# Patient Record
Sex: Female | Born: 1970
Health system: Southern US, Community
[De-identification: ages and names within clinical notes are randomized; demographics above are authoritative.]

## PROBLEM LIST (undated history)

## (undated) DIAGNOSIS — T4145XA Adverse effect of unspecified anesthetic, initial encounter: Secondary | ICD-10-CM

## (undated) DIAGNOSIS — K219 Gastro-esophageal reflux disease without esophagitis: Secondary | ICD-10-CM

## (undated) DIAGNOSIS — T884XXA Failed or difficult intubation, initial encounter: Secondary | ICD-10-CM

## (undated) DIAGNOSIS — T8859XA Other complications of anesthesia, initial encounter: Secondary | ICD-10-CM

## (undated) HISTORY — PX: CERVICAL BIOPSY  W/ LOOP ELECTRODE EXCISION: SUR135

## (undated) HISTORY — PX: ABDOMINAL HYSTERECTOMY: SHX81

## (undated) HISTORY — PX: LEEP: SHX91

## (undated) HISTORY — PX: DILATION AND CURETTAGE OF UTERUS: SHX78

## (undated) HISTORY — PX: ENDOMETRIAL ABLATION: SHX621

---

## 2004-08-14 HISTORY — PX: BREAST BIOPSY: SHX20

## 2005-11-29 ENCOUNTER — Ambulatory Visit: Payer: Self-pay

## 2006-12-03 ENCOUNTER — Ambulatory Visit: Payer: Self-pay | Admitting: General Surgery

## 2007-05-18 ENCOUNTER — Ambulatory Visit: Payer: Self-pay | Admitting: Internal Medicine

## 2009-01-12 ENCOUNTER — Ambulatory Visit: Payer: Self-pay

## 2009-01-14 ENCOUNTER — Ambulatory Visit: Payer: Self-pay

## 2010-09-22 ENCOUNTER — Ambulatory Visit: Payer: Self-pay

## 2010-09-29 ENCOUNTER — Ambulatory Visit: Payer: Self-pay

## 2010-10-03 LAB — PATHOLOGY REPORT

## 2011-12-11 ENCOUNTER — Encounter: Payer: Self-pay | Admitting: Internal Medicine

## 2011-12-11 ENCOUNTER — Ambulatory Visit (INDEPENDENT_AMBULATORY_CARE_PROVIDER_SITE_OTHER): Payer: BC Managed Care – PPO | Admitting: Internal Medicine

## 2011-12-11 VITALS — BP 126/78 | HR 78 | Temp 98.6°F | Resp 14 | Ht 62.0 in | Wt 152.0 lb

## 2011-12-11 DIAGNOSIS — N920 Excessive and frequent menstruation with regular cycle: Secondary | ICD-10-CM

## 2011-12-11 LAB — HM PAP SMEAR: HM Pap smear: NORMAL

## 2011-12-11 LAB — HM MAMMOGRAPHY

## 2011-12-11 NOTE — Assessment & Plan Note (Signed)
Will get notes from OB/GYN as to previous evaluation and management. Discussed with patient potential of starting oral contraceptive pill to help with bleeding, but she prefers not to take daily medication if possible. We'll obtain recent lab work, including CBC to see if any evidence of iron deficiency anemia. She will followup here as needed and in one year for physical exam.

## 2011-12-11 NOTE — Progress Notes (Signed)
  Subjective:    Patient ID: Kristina Pena, female    DOB: 06/09/1971, 41 y.o.   MRN: [id number]  HPI 41 year old female with history of menorrhagia presents to establish care. She reports she is generally feeling well. She has no new concerns today. In regards to her history of menorrhagia, she reports that she has undergone endometrial ablation and D&C with no improvement in her bleeding. She is currently discussing hysterectomy with her OB/GYN. She denies feeling significant fatigue, lightheadedness, headaches, or other complaints.  No outpatient encounter prescriptions on file as of 12/11/2011.    Review of Systems  Constitutional: Negative for fever, chills, appetite change, fatigue and unexpected weight change.  HENT: Negative for ear pain, congestion, sore throat, trouble swallowing, neck pain, voice change and sinus pressure.   Eyes: Negative for visual disturbance.  Respiratory: Negative for cough, shortness of breath, wheezing and stridor.   Cardiovascular: Negative for chest pain, palpitations and leg swelling.  Gastrointestinal: Negative for nausea, vomiting, abdominal pain, diarrhea, constipation, blood in stool, abdominal distention and anal bleeding.  Genitourinary: Negative for dysuria and flank pain.  Musculoskeletal: Negative for myalgias, arthralgias and gait problem.  Skin: Negative for color change and rash.  Neurological: Negative for dizziness and headaches.  Hematological: Negative for adenopathy. Does not bruise/bleed easily.  Psychiatric/Behavioral: Negative for suicidal ideas, sleep disturbance and dysphoric mood. The patient is not nervous/anxious.    BP 126/78  Pulse 78  Temp(Src) 98.6 F (37 C) (Oral)  Resp 14  Ht 5\' 2"  (1.575 m)  Wt 152 lb (68.947 kg)  BMI 27.80 kg/m2  LMP 11/29/2011     Objective:   Physical Exam  Constitutional: She is oriented to person, place, and time. She appears well-developed and well-nourished. No distress.  HENT:  Head:  Normocephalic and atraumatic.  Right Ear: External ear normal.  Left Ear: External ear normal.  Nose: Nose normal.  Mouth/Throat: Oropharynx is clear and moist. No oropharyngeal exudate.  Eyes: Conjunctivae are normal. Pupils are equal, round, and reactive to light. Right eye exhibits no discharge. Left eye exhibits no discharge. No scleral icterus.  Neck: Normal range of motion. Neck supple. No tracheal deviation present. No thyromegaly present.  Cardiovascular: Normal rate, regular rhythm, normal heart sounds and intact distal pulses.  Exam reveals no gallop and no friction rub.   No murmur heard. Pulmonary/Chest: Effort normal and breath sounds normal. No respiratory distress. She has no wheezes. She has no rales. She exhibits no tenderness.  Abdominal: Soft. Bowel sounds are normal. She exhibits no distension. There is no tenderness.  Musculoskeletal: Normal range of motion. She exhibits no edema and no tenderness.  Lymphadenopathy:    She has no cervical adenopathy.  Neurological: She is alert and oriented to person, place, and time. No cranial nerve deficit. She exhibits normal muscle tone. Coordination normal.  Skin: Skin is warm and dry. No rash noted. She is not diaphoretic. No erythema. No pallor.  Psychiatric: She has a normal mood and affect. Her behavior is normal. Judgment and thought content normal.          Assessment & Plan:

## 2011-12-12 ENCOUNTER — Ambulatory Visit: Payer: Self-pay

## 2012-04-05 ENCOUNTER — Telehealth: Payer: Self-pay | Admitting: Internal Medicine

## 2012-04-05 NOTE — Telephone Encounter (Signed)
Patient advised as instructed via telephone. 

## 2012-04-05 NOTE — Telephone Encounter (Signed)
Recent lab work reviewed from screening at [facility] looked fine.

## 2012-12-11 ENCOUNTER — Encounter: Payer: BC Managed Care – PPO | Admitting: Internal Medicine

## 2013-02-11 HISTORY — PX: PARTIAL HYSTERECTOMY: SHX80

## 2013-02-18 ENCOUNTER — Ambulatory Visit: Payer: Self-pay | Admitting: Obstetrics and Gynecology

## 2013-02-18 LAB — HEMOGLOBIN: HGB: 13.7 g/dL (ref 12.0–16.0)

## 2013-02-18 LAB — BASIC METABOLIC PANEL
BUN: 10 mg/dL (ref 7–18)
Calcium, Total: 8.9 mg/dL (ref 8.5–10.1)
Co2: 28 mmol/L (ref 21–32)
EGFR (African American): >60
Glucose: 94 mg/dL (ref 65–99)
Potassium: 4.3 mmol/L (ref 3.5–5.1)

## 2013-02-24 ENCOUNTER — Ambulatory Visit: Payer: Self-pay | Admitting: Obstetrics and Gynecology

## 2013-02-25 LAB — BASIC METABOLIC PANEL
Calcium, Total: 8.3 mg/dL — ABNORMAL LOW (ref 8.5–10.1)
Chloride: 109 mmol/L — ABNORMAL HIGH (ref 98–107)
EGFR (African American): >60
Glucose: 103 mg/dL — ABNORMAL HIGH (ref 65–99)
Osmolality: 277 (ref 275–301)
Potassium: 3.3 mmol/L — ABNORMAL LOW (ref 3.5–5.1)
Sodium: 140 mmol/L (ref 136–145)

## 2013-07-17 ENCOUNTER — Ambulatory Visit: Payer: Self-pay | Admitting: Obstetrics and Gynecology

## 2013-08-02 LAB — HM MAMMOGRAPHY: HM Mammogram: NORMAL

## 2013-12-31 ENCOUNTER — Encounter: Payer: Self-pay | Admitting: *Deleted

## 2013-12-31 ENCOUNTER — Encounter: Payer: Self-pay | Admitting: Internal Medicine

## 2013-12-31 ENCOUNTER — Encounter (INDEPENDENT_AMBULATORY_CARE_PROVIDER_SITE_OTHER): Payer: Self-pay

## 2013-12-31 ENCOUNTER — Ambulatory Visit (INDEPENDENT_AMBULATORY_CARE_PROVIDER_SITE_OTHER): Payer: BC Managed Care – PPO | Admitting: Internal Medicine

## 2013-12-31 VITALS — BP 110/70 | HR 78 | Temp 98.3°F | Ht 62.5 in | Wt 165.8 lb

## 2013-12-31 DIAGNOSIS — Z0000 Encounter for general adult medical examination without abnormal findings: Secondary | ICD-10-CM

## 2013-12-31 DIAGNOSIS — E663 Overweight: Secondary | ICD-10-CM

## 2013-12-31 LAB — CBC WITH DIFFERENTIAL/PLATELET
Basophils Absolute: 0 10*3/uL (ref 0.0–0.1)
Basophils Relative: 0.3 % (ref 0.0–3.0)
EOS PCT: 3.2 % (ref 0.0–5.0)
Eosinophils Absolute: 0.2 10*3/uL (ref 0.0–0.7)
HEMATOCRIT: 40.9 % (ref 36.0–46.0)
Hemoglobin: 14.1 g/dL (ref 12.0–15.0)
LYMPHS ABS: 2 10*3/uL (ref 0.7–4.0)
Lymphocytes Relative: 29.9 % (ref 12.0–46.0)
MCHC: 34.4 g/dL (ref 30.0–36.0)
MCV: 93.9 fl (ref 78.0–100.0)
MONO ABS: 0.4 10*3/uL (ref 0.1–1.0)
Monocytes Relative: 6.3 % (ref 3.0–12.0)
Neutro Abs: 4 10*3/uL (ref 1.4–7.7)
Neutrophils Relative %: 60.3 % (ref 43.0–77.0)
PLATELETS: 330 10*3/uL (ref 150.0–400.0)
RBC: 4.35 Mil/uL (ref 3.87–5.11)
RDW: 13 % (ref 11.5–15.5)
WBC: 6.7 10*3/uL (ref 4.0–10.5)

## 2013-12-31 LAB — LIPID PANEL
CHOLESTEROL: 203 mg/dL — AB (ref 0–200)
HDL: 44.2 mg/dL (ref >39.00)
LDL CALC: 138 mg/dL — AB (ref 0–99)
TRIGLYCERIDES: 106 mg/dL (ref 0.0–149.0)
Total CHOL/HDL Ratio: 5
VLDL: 21.2 mg/dL (ref 0.0–40.0)

## 2013-12-31 LAB — COMPREHENSIVE METABOLIC PANEL
ALK PHOS: 81 U/L (ref 39–117)
ALT: 23 U/L (ref 0–35)
AST: 25 U/L (ref 0–37)
Albumin: 4 g/dL (ref 3.5–5.2)
BILIRUBIN TOTAL: 0.3 mg/dL (ref 0.2–1.2)
BUN: 13 mg/dL (ref 6–23)
CO2: 25 mEq/L (ref 19–32)
Calcium: 9.4 mg/dL (ref 8.4–10.5)
Chloride: 107 mEq/L (ref 96–112)
Creatinine, Ser: 0.5 mg/dL (ref 0.4–1.2)
GFR: 131.52 mL/min (ref >60.00)
GLUCOSE: 93 mg/dL (ref 70–99)
Potassium: 4.2 mEq/L (ref 3.5–5.1)
SODIUM: 140 meq/L (ref 135–145)
TOTAL PROTEIN: 7 g/dL (ref 6.0–8.3)

## 2013-12-31 LAB — MICROALBUMIN / CREATININE URINE RATIO
Creatinine,U: 34.1 mg/dL
Microalb Creat Ratio: 1.2 mg/g (ref 0.0–30.0)
Microalb, Ur: 0.4 mg/dL (ref 0.0–1.9)

## 2013-12-31 NOTE — Progress Notes (Signed)
Pre visit review using our clinic review tool, if applicable. No additional management support is needed unless otherwise documented below in the visit note. 

## 2013-12-31 NOTE — Assessment & Plan Note (Signed)
Encouraged healthy diet and exercise with goal of weight loss.

## 2013-12-31 NOTE — Assessment & Plan Note (Signed)
General medical exam normal today. PAP and pelvic and breast exam deferred as recently completed by OB. Will request PAP and mammogram report. Encouraged healthy diet and exercise. Immunizations are UTD. Will check labs today CBC, CMP, lipids. Follow up 1 year and prn.

## 2013-12-31 NOTE — Progress Notes (Signed)
Subjective:    Patient ID: Kristina Pena, female    DOB: 12/16/1970, 43 y.o.   MRN: [id number]  HPI 43YO female presents for annual exam. Doing well. S/p Hysterectomy last July. Eating a healthy diet and exercising by walking. Mammogram and PAP UTD with OB/GYN.  Review of Systems  Constitutional: Negative for fever, chills, appetite change, fatigue and unexpected weight change.  HENT: Negative for congestion, ear pain, sinus pressure, sore throat, trouble swallowing and voice change.   Eyes: Negative for visual disturbance.  Respiratory: Negative for cough, shortness of breath, wheezing and stridor.   Cardiovascular: Negative for chest pain, palpitations and leg swelling.  Gastrointestinal: Negative for nausea, vomiting, abdominal pain, diarrhea, constipation, blood in stool, abdominal distention and anal bleeding.  Genitourinary: Negative for dysuria and flank pain.  Musculoskeletal: Negative for arthralgias, gait problem, myalgias and neck pain.  Skin: Negative for color change and rash.  Neurological: Negative for dizziness and headaches.  Hematological: Negative for adenopathy. Does not bruise/bleed easily.  Psychiatric/Behavioral: Negative for suicidal ideas, sleep disturbance and dysphoric mood. The patient is not nervous/anxious.        Objective:    BP 110/70  Pulse 78  Temp(Src) 98.3 F (36.8 C) (Oral)  Ht 5' 2.5" (1.588 m)  Wt 165 lb 12 oz (75.184 kg)  BMI 29.81 kg/m2  SpO2 97%  LMP 11/29/2011 Waist Circumference 39in Physical Exam  Constitutional: She is oriented to person, place, and time. She appears well-developed and well-nourished. No distress.  HENT:  Head: Normocephalic and atraumatic.  Right Ear: External ear normal.  Left Ear: External ear normal.  Nose: Nose normal.  Mouth/Throat: Oropharynx is clear and moist. No oropharyngeal exudate.  Eyes: Conjunctivae are normal. Pupils are equal, round, and reactive to light. Right eye exhibits no discharge.  Left eye exhibits no discharge. No scleral icterus.  Neck: Normal range of motion. Neck supple. No tracheal deviation present. No thyromegaly present.  Cardiovascular: Normal rate, regular rhythm, normal heart sounds and intact distal pulses.  Exam reveals no gallop and no friction rub.   No murmur heard. Pulmonary/Chest: Effort normal and breath sounds normal. No accessory muscle usage. Not tachypneic. No respiratory distress. She has no decreased breath sounds. She has no wheezes. She has no rhonchi. She has no rales. She exhibits no tenderness.  Abdominal: Soft. Bowel sounds are normal. She exhibits no distension and no mass. There is no tenderness. There is no rebound and no guarding.  Musculoskeletal: Normal range of motion. She exhibits no edema and no tenderness.  Lymphadenopathy:    She has no cervical adenopathy.  Neurological: She is alert and oriented to person, place, and time. No cranial nerve deficit. She exhibits normal muscle tone. Coordination normal.  Skin: Skin is warm and dry. No rash noted. She is not diaphoretic. No erythema. No pallor.  Psychiatric: She has a normal mood and affect. Her behavior is normal. Judgment and thought content normal.          Assessment & Plan:   Problem List Items Addressed This Visit     Unprioritized   Overweight (BMI 25.0-29.9)     Encouraged healthy diet and exercise with goal of weight loss.    Routine general medical examination at a health care facility - Primary     General medical exam normal today. PAP and pelvic and breast exam deferred as recently completed by OB. Will request PAP and mammogram report. Encouraged healthy diet and exercise. Immunizations are UTD. Will check labs  today CBC, CMP, lipids. Follow up 1 year and prn.    Relevant Orders      CBC with Differential      Comprehensive metabolic panel      Lipid panel      Microalbumin / creatinine urine ratio      Vit D  25 hydroxy (rtn osteoporosis monitoring)         Return in about 1 year (around 01/01/2015) for Physical.

## 2014-01-01 ENCOUNTER — Encounter: Payer: Self-pay | Admitting: *Deleted

## 2014-01-01 LAB — VITAMIN D 25 HYDROXY (VIT D DEFICIENCY, FRACTURES): Vit D, 25-Hydroxy: 38 ng/mL (ref 30–89)

## 2014-01-08 ENCOUNTER — Telehealth: Payer: Self-pay | Admitting: Internal Medicine

## 2014-01-08 NOTE — Telephone Encounter (Signed)
Pt came into office requesting paperwork Dr. Dan Humphreys recently filled out for [facility] for employer (husband's) [employer].  States she would like to fax the paperwork in herself.  Also asking for results of blood work from last week; printed for pt.

## 2014-06-03 ENCOUNTER — Encounter: Payer: Self-pay | Admitting: Internal Medicine

## 2014-06-03 ENCOUNTER — Ambulatory Visit (INDEPENDENT_AMBULATORY_CARE_PROVIDER_SITE_OTHER): Payer: BC Managed Care – PPO | Admitting: Internal Medicine

## 2014-06-03 VITALS — BP 130/78 | HR 90 | Temp 98.6°F | Ht 62.5 in | Wt 164.8 lb

## 2014-06-03 DIAGNOSIS — K648 Other hemorrhoids: Secondary | ICD-10-CM

## 2014-06-03 DIAGNOSIS — K644 Residual hemorrhoidal skin tags: Secondary | ICD-10-CM | POA: Insufficient documentation

## 2014-06-03 MED ORDER — HYDROCORTISONE ACE-PRAMOXINE 2.5-1 % RE CREA: 1.0000 "application " | TOPICAL_CREAM | Freq: Three times a day (TID) | RECTAL | Status: DC

## 2014-06-03 NOTE — Patient Instructions (Signed)
Start Analpram up to three times daily. Follow up as scheduled with Dr. Katrinka BlazingSmith if no improvement.  Hemorrhoids Hemorrhoids are swollen veins around the rectum or anus. There are two types of hemorrhoids:   Internal hemorrhoids. These occur in the veins just inside the rectum. They may poke through to the outside and become irritated and painful.  External hemorrhoids. These occur in the veins outside the anus and can be felt as a painful swelling or hard lump near the anus. CAUSES  Pregnancy.   Obesity.   Constipation or diarrhea.   Straining to have a bowel movement.   Sitting for long periods on the toilet.  Heavy lifting or other activity that caused you to strain.  Anal intercourse. SYMPTOMS   Pain.   Anal itching or irritation.   Rectal bleeding.   Fecal leakage.   Anal swelling.   One or more lumps around the anus.  DIAGNOSIS  Your caregiver may be able to diagnose hemorrhoids by visual examination. Other examinations or tests that may be performed include:   Examination of the rectal area with a gloved hand (digital rectal exam).   Examination of anal canal using a small tube (scope).   A blood test if you have lost a significant amount of blood.  A test to look inside the colon (sigmoidoscopy or colonoscopy). TREATMENT Most hemorrhoids can be treated at home. However, if symptoms do not seem to be getting better or if you have a lot of rectal bleeding, your caregiver may perform a procedure to help make the hemorrhoids get smaller or remove them completely. Possible treatments include:   Placing a rubber band at the base of the hemorrhoid to cut off the circulation (rubber band ligation).   Injecting a chemical to shrink the hemorrhoid (sclerotherapy).   Using a tool to burn the hemorrhoid (infrared light therapy).   Surgically removing the hemorrhoid (hemorrhoidectomy).   Stapling the hemorrhoid to block blood flow to the tissue  (hemorrhoid stapling).  HOME CARE INSTRUCTIONS   Eat foods with fiber, such as whole grains, beans, nuts, fruits, and vegetables. Ask your doctor about taking products with added fiber in them (fibersupplements).  Increase fluid intake. Drink enough water and fluids to keep your urine clear or pale yellow.   Exercise regularly.   Go to the bathroom when you have the urge to have a bowel movement. Do not wait.   Avoid straining to have bowel movements.   Keep the anal area dry and clean. Use wet toilet paper or moist towelettes after a bowel movement.   Medicated creams and suppositories may be used or applied as directed.   Only take over-the-counter or prescription medicines as directed by your caregiver.   Take warm sitz baths for 15-20 minutes, 3-4 times a day to ease pain and discomfort.   Place ice packs on the hemorrhoids if they are tender and swollen. Using ice packs between sitz baths may be helpful.   Put ice in a plastic bag.   Place a towel between your skin and the bag.   Leave the ice on for 15-20 minutes, 3-4 times a day.   Do not use a donut-shaped pillow or sit on the toilet for long periods. This increases blood pooling and pain.  SEEK MEDICAL CARE IF:  You have increasing pain and swelling that is not controlled by treatment or medicine.  You have uncontrolled bleeding.  You have difficulty or you are unable to have a bowel movement.  You have pain or inflammation outside the area of the hemorrhoids. MAKE SURE YOU:  Understand these instructions.  Will watch your condition.  Will get help right away if you are not doing well or get worse. Document Released: 07/28/2000 Document Revised: 07/17/2012 Document Reviewed: 06/04/2012 [facility] Patient Information 2015 [place], [place]. This information is not intended to replace advice given to you by your health care provider. Make sure you discuss any questions you have with your health care  provider.

## 2014-06-03 NOTE — Assessment & Plan Note (Signed)
Small external hemorrhoid. Start Analpram. Continue pre-moistened toilet paper and Tuck's pads. Follow up with Dr. Katrinka BlazingSmith as scheduled if no improvement.

## 2014-06-03 NOTE — Progress Notes (Signed)
Pre visit review using our clinic review tool, if applicable. No additional management support is needed unless otherwise documented below in the visit note. 

## 2014-06-03 NOTE — Progress Notes (Signed)
   Subjective:    Patient ID: Kristina Pena, female    DOB: 01/26/1971, 43 y.o.   MRN: [id number]  HPI 43YO female presents for acute visit for hemorrhoids.  Hemorrhoid - Has had hemorrhoids ever since birth of her son 16 years ago. On October 10th, had diarrhea, hemorrhoid started to protrude more. Seemed to improve some, but not completely resolved. Felt hemorrhoid again this morning. Using Prep H and Tucks pads. No blood in stool, but occasional blood with wiping.    Review of Systems  Constitutional: Negative for fever and chills.  Respiratory: Negative for shortness of breath.   Cardiovascular: Negative for chest pain.  Gastrointestinal: Positive for diarrhea, anal bleeding and rectal pain. Negative for abdominal pain.       Objective:    BP 130/78  Pulse 90  Temp(Src) 98.6 F (37 C) (Oral)  Ht 5' 2.5" (1.588 m)  Wt 164 lb 12 oz (74.73 kg)  BMI 29.63 kg/m2  SpO2 97%  LMP 11/29/2011 Physical Exam  Constitutional: She is oriented to person, place, and time. She appears well-developed and well-nourished. No distress.  HENT:  Head: Normocephalic and atraumatic.  Right Ear: External ear normal.  Left Ear: External ear normal.  Nose: Nose normal.  Mouth/Throat: Oropharynx is clear and moist.  Eyes: Conjunctivae are normal. Pupils are equal, round, and reactive to light. Right eye exhibits no discharge. Left eye exhibits no discharge. No scleral icterus.  Neck: Normal range of motion. Neck supple. No tracheal deviation present. No thyromegaly present.  Pulmonary/Chest: Effort normal. No accessory muscle usage. Not tachypneic. She has no decreased breath sounds. She has no rhonchi.  Genitourinary: Rectal exam shows external hemorrhoid and tenderness. Rectal exam shows no fissure.     Musculoskeletal: Normal range of motion. She exhibits no edema and no tenderness.  Lymphadenopathy:    She has no cervical adenopathy.  Neurological: She is alert and oriented to person,  place, and time. No cranial nerve deficit. She exhibits normal muscle tone. Coordination normal.  Skin: Skin is warm and dry. No rash noted. She is not diaphoretic. No erythema. No pallor.  Psychiatric: She has a normal mood and affect. Her behavior is normal. Judgment and thought content normal.          Assessment & Plan:   Problem List Items Addressed This Visit     Unprioritized   External hemorrhoid - Primary     Small external hemorrhoid. Start Analpram. Continue pre-moistened toilet paper and Tuck's pads. Follow up with Dr. Katrinka BlazingSmith as scheduled if no improvement.    Relevant Medications      ANALPRAM-HC 1-2.5 % RE CREA       Return in about 2 weeks (around 06/17/2014), or if symptoms worsen or fail to improve.

## 2014-12-04 ENCOUNTER — Other Ambulatory Visit: Payer: Self-pay | Admitting: Internal Medicine

## 2014-12-04 DIAGNOSIS — K644 Residual hemorrhoidal skin tags: Secondary | ICD-10-CM

## 2014-12-04 NOTE — Op Note (Signed)
PATIENT NAME:  Kristina Pena, Kristina Pena MR#:  [id number] DATE OF BIRTH:  1970-10-01  DATE OF PROCEDURE:  02/24/2013  PREOPERATIVE DIAGNOSIS: Menorrhagia, failed NovaSure endometrial ablation.   POSTOPERATIVE DIAGNOSIS: Menorrhagia, failed NovaSure endometrial ablation.   PROCEDURE: Total vaginal hysterectomy.   SURGEON: Kristina Bouchardhomas J. Alissa Pharr, MD  FIRST ASSISTANT:  Veatrice Bourbonicky Evans, MD  ANESTHESIA:  General endotracheal.   INDICATIONS: This is a 44 year old gravida 2, para 2, a patient with a long history of menorrhagia. The patient is status post NovaSure endometrial ablation which did not alleviate her heavy vaginal bleeding.   DESCRIPTION OF PROCEDURE: After adequate general endotracheal anesthesia, the patient was placed in the dorsal supine position with the legs in the candy-cane stirrups. The patient received 2 grams IV cefoxitin prior to commencement of the case. Lower abdomen, perineal and vagina were prepped in normal sterile fashion. The patient was sterilely draped. A straight red Robinson catheter was placed into the bladder, yielded 50 mL clear urine. A weighted speculum was placed in the posterior vaginal vault. The cervix was grasped with 2 thyroid tenacula, and the cervix was circumferentially injected with 1% lidocaine with 1:100,000 epinephrine. A direct posterior colpotomy incision was made. Upon entry into the posterior cul-de-sac, a long-billed weighted speculum was placed. The uterosacral ligaments were bilaterally clamped, transected and suture ligated with 0 Vicryl suture and were tagged for later use. The cervix was then circumferentially incised with the Bovie and the anterior cul-de-sac was entered with sharp dissection without difficulty. A Deaver was placed within the anterior cul-de-sac to elevate the bladder anteriorly. The cardinal ligaments were bilaterally clamped, transected and suture ligated with 0 Vicryl suture. The uterine arteries were bilaterally clamped, transected and  suture ligated with 0 Vicryl suture. Sequential clamps on the broad ligament were used, were transected and sutured with 0 Vicryl suture. The cornua were bilaterally clamped and the uterus was delivered. Each cornual pedicle was doubly ligated with 0 Vicryl suture. The pedicles appeared hemostatic. The peritoneum was then closed with a 2-0 PDS suture in a running pursestring fashion. The vaginal vault was then closed with 0 Vicryl running suture. The uterosacral ligaments were plicated centrally and the rest of the vaginal vault was closed with the remaining 0 Vicryl suture. Good hemostasis was noted. No complications. Estimated blood loss 100 mL. In and out fluids 1400 mL. Of note, the patient did have some PACs and some respiratory wheezing during anesthesia, which resolved by the time that she was in the PAC-U. The patient was taken to the recovery room in good condition. ____________________________ Kristina Bouchardhomas J. Gena Laski, MD tjs:sb D: 02/24/2013 10:51:00 ET T: 02/24/2013 12:27:57 ET JOB#: [id number]  cc: Kristina Bouchardhomas J. Aadvik Roker, MD, <Dictator> Kristina BouchardHOMAS J Marcelle Bebout MD ELECTRONICALLY SIGNED 03/03/2013 9:07

## 2014-12-07 MED ORDER — HYDROCORTISONE ACE-PRAMOXINE 2.5-1 % RE CREA: 1.0000 "application " | TOPICAL_CREAM | Freq: Three times a day (TID) | RECTAL | Status: DC

## 2014-12-07 NOTE — Telephone Encounter (Signed)
Ok refill? 

## 2014-12-29 ENCOUNTER — Other Ambulatory Visit: Payer: Self-pay | Admitting: Internal Medicine

## 2014-12-29 DIAGNOSIS — K644 Residual hemorrhoidal skin tags: Secondary | ICD-10-CM

## 2014-12-29 NOTE — Telephone Encounter (Signed)
Ok refill? 

## 2014-12-30 MED ORDER — HYDROCORTISONE ACE-PRAMOXINE 2.5-1 % RE CREA: 1.0000 "application " | TOPICAL_CREAM | Freq: Three times a day (TID) | RECTAL | Status: DC

## 2015-02-05 ENCOUNTER — Other Ambulatory Visit: Payer: Self-pay | Admitting: Orthopedic Surgery

## 2015-02-05 DIAGNOSIS — M25511 Pain in right shoulder: Secondary | ICD-10-CM

## 2015-02-09 ENCOUNTER — Ambulatory Visit: Payer: Self-pay

## 2015-02-09 ENCOUNTER — Ambulatory Visit
Admission: RE | Admit: 2015-02-09 | Discharge: 2015-02-09 | Disposition: A | Discharge status: Home / Self Care | Payer: BLUE CROSS/BLUE SHIELD | Source: Ambulatory Visit | Attending: Orthopedic Surgery | Admitting: Orthopedic Surgery

## 2015-02-09 DIAGNOSIS — W19XXXA Unspecified fall, initial encounter: Secondary | ICD-10-CM | POA: Insufficient documentation

## 2015-02-09 DIAGNOSIS — M7551 Bursitis of right shoulder: Secondary | ICD-10-CM | POA: Diagnosis not present

## 2015-02-09 DIAGNOSIS — M25511 Pain in right shoulder: Secondary | ICD-10-CM | POA: Diagnosis present

## 2015-03-15 IMAGING — MG MM DIGITAL SCREENING BILAT W/ CAD
1 series · 4 of 4 positions shown · non-contrast
Comparison: Previous exam(s).

CLINICAL DATA: Screening.

EXAM:
DIGITAL SCREENING BILATERAL MAMMOGRAM WITH CAD

[R CC · right · 4 of 4 slices shown]
[im 1/4]
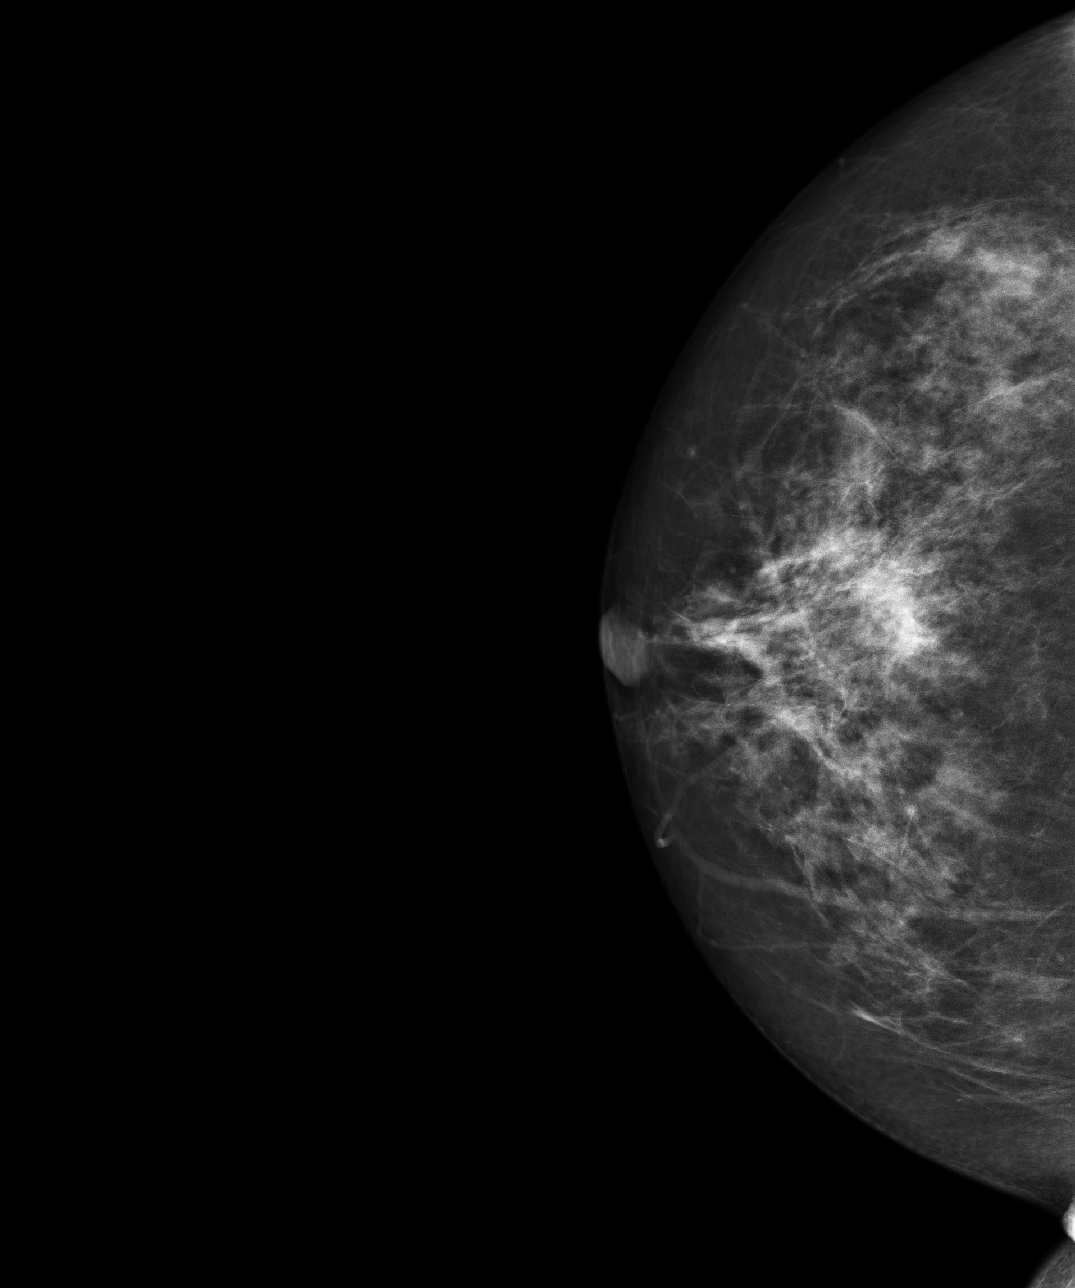
[im 2/4]
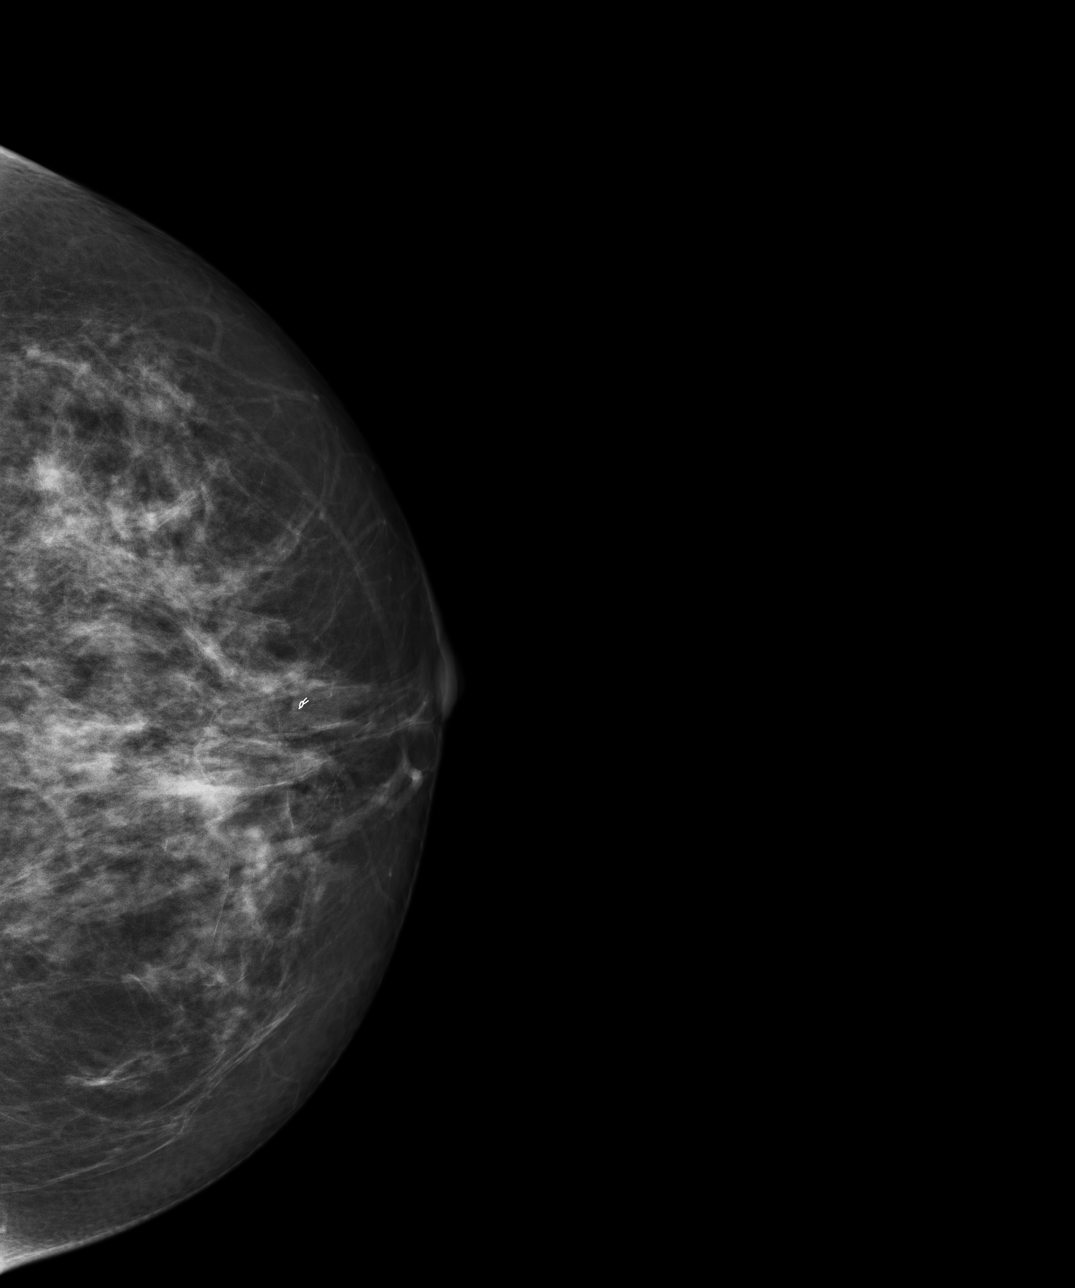
[im 3/4]
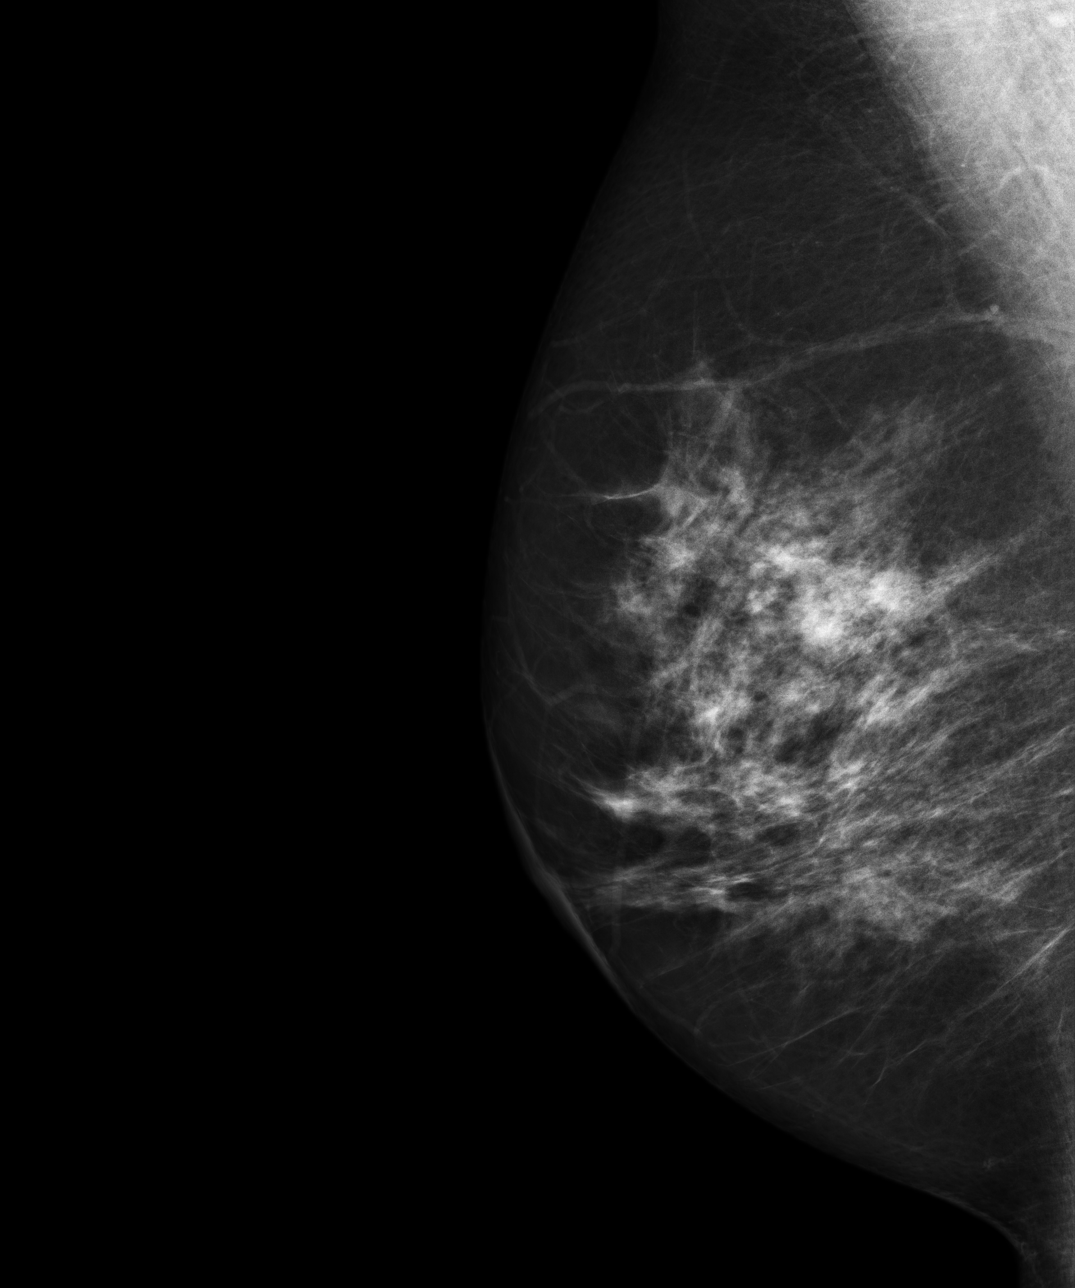
[im 4/4]
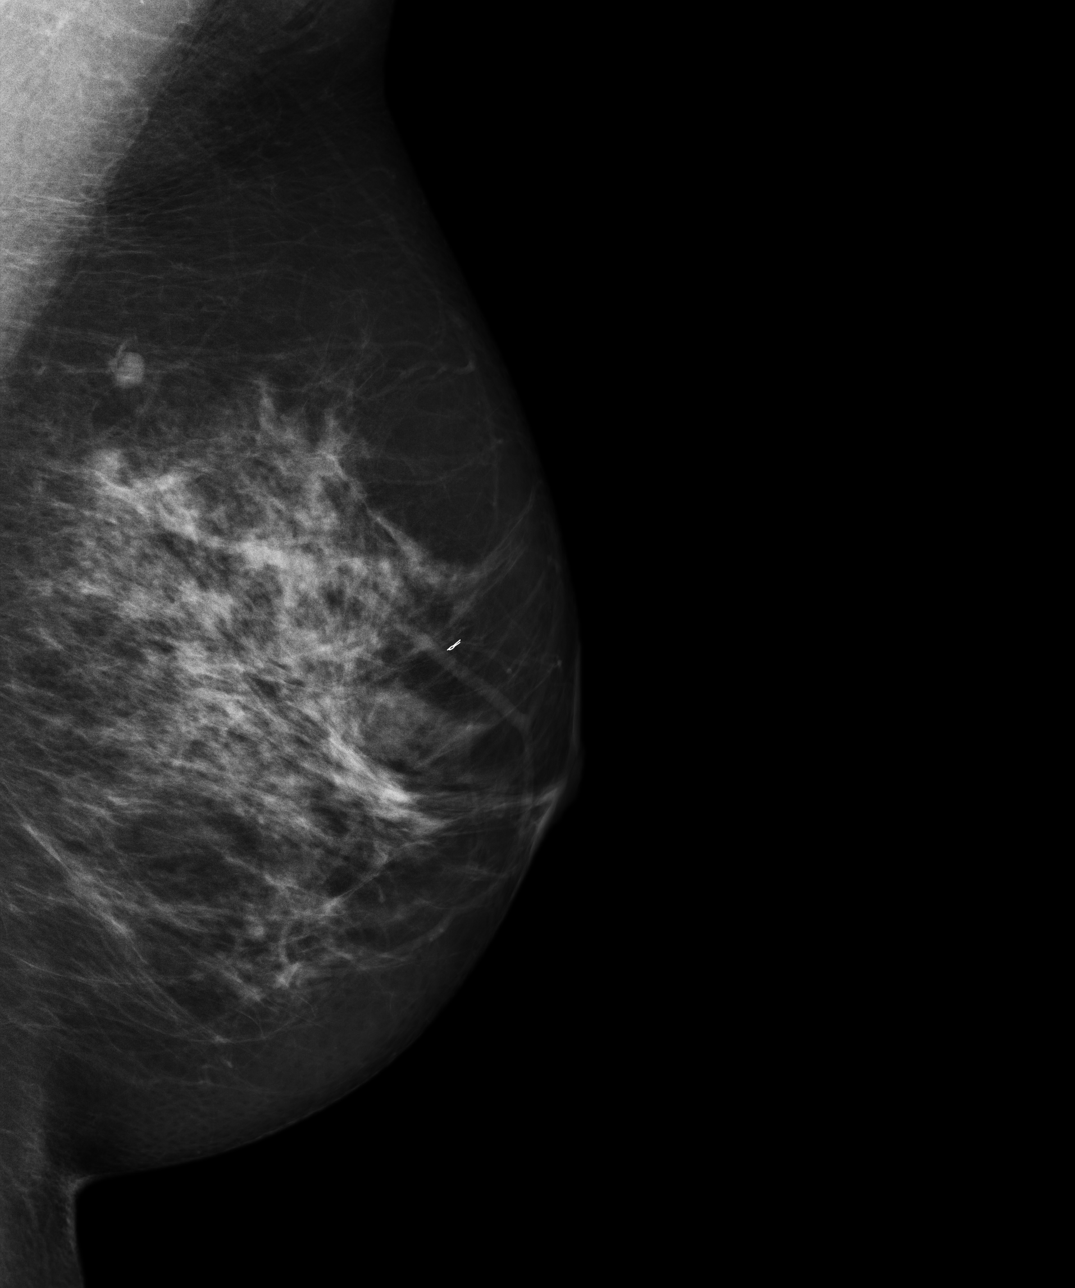

[4 of 4 positions shown; findings below may reference images not displayed]

ACR Breast Density Category c: The breasts are heterogeneously
dense, which may obscure small masses.
FINDINGS: There are no findings suspicious for malignancy. Images were
processed with CAD.
IMPRESSION: No mammographic evidence of malignancy. A result letter of this
screening mammogram will be mailed directly to the patient.

RECOMMENDATION:
Screening mammogram in one year. (Code:ZN-B-8X6)

BI-RADS CATEGORY  1: Negative

## 2015-06-22 ENCOUNTER — Other Ambulatory Visit: Payer: Self-pay | Admitting: Obstetrics and Gynecology

## 2015-06-22 DIAGNOSIS — Z1231 Encounter for screening mammogram for malignant neoplasm of breast: Secondary | ICD-10-CM

## 2015-06-24 ENCOUNTER — Ambulatory Visit
Admission: RE | Admit: 2015-06-24 | Discharge: 2015-06-24 | Disposition: A | Discharge status: Home / Self Care | Payer: BLUE CROSS/BLUE SHIELD | Source: Ambulatory Visit | Attending: Obstetrics and Gynecology | Admitting: Obstetrics and Gynecology

## 2015-06-24 DIAGNOSIS — Z1231 Encounter for screening mammogram for malignant neoplasm of breast: Secondary | ICD-10-CM | POA: Diagnosis present

## 2015-06-29 ENCOUNTER — Ambulatory Visit: Payer: BLUE CROSS/BLUE SHIELD

## 2015-08-06 ENCOUNTER — Other Ambulatory Visit: Payer: Self-pay | Admitting: Internal Medicine

## 2015-08-06 DIAGNOSIS — K644 Residual hemorrhoidal skin tags: Secondary | ICD-10-CM

## 2015-08-06 MED ORDER — HYDROCORTISONE ACE-PRAMOXINE 2.5-1 % RE CREA: 1.0000 "application " | TOPICAL_CREAM | Freq: Three times a day (TID) | RECTAL | Status: DC

## 2016-05-30 ENCOUNTER — Encounter: Payer: Self-pay | Admitting: Internal Medicine

## 2016-05-30 ENCOUNTER — Ambulatory Visit (INDEPENDENT_AMBULATORY_CARE_PROVIDER_SITE_OTHER): Payer: Managed Care, Other (non HMO) | Admitting: Internal Medicine

## 2016-05-30 VITALS — BP 124/70 | HR 73 | Temp 98.9°F | Ht 61.75 in | Wt 153.5 lb

## 2016-05-30 DIAGNOSIS — Z114 Encounter for screening for human immunodeficiency virus [HIV]: Secondary | ICD-10-CM | POA: Diagnosis not present

## 2016-05-30 DIAGNOSIS — Z0000 Encounter for general adult medical examination without abnormal findings: Secondary | ICD-10-CM

## 2016-05-30 DIAGNOSIS — Z0001 Encounter for general adult medical examination with abnormal findings: Secondary | ICD-10-CM

## 2016-05-30 DIAGNOSIS — Z7689 Persons encountering health services in other specified circumstances: Secondary | ICD-10-CM

## 2016-05-30 LAB — COMPREHENSIVE METABOLIC PANEL
ALT: 15 U/L (ref 0–35)
AST: 17 U/L (ref 0–37)
Albumin: 4.3 g/dL (ref 3.5–5.2)
Alkaline Phosphatase: 60 U/L (ref 39–117)
BILIRUBIN TOTAL: 0.4 mg/dL (ref 0.2–1.2)
BUN: 10 mg/dL (ref 6–23)
CO2: 28 mEq/L (ref 19–32)
Calcium: 9.5 mg/dL (ref 8.4–10.5)
Chloride: 107 mEq/L (ref 96–112)
Creatinine, Ser: 0.6 mg/dL (ref 0.40–1.20)
GFR: 115.15 mL/min (ref >60.00)
GLUCOSE: 91 mg/dL (ref 70–99)
Potassium: 4.4 mEq/L (ref 3.5–5.1)
SODIUM: 142 meq/L (ref 135–145)
TOTAL PROTEIN: 7 g/dL (ref 6.0–8.3)

## 2016-05-30 LAB — LIPID PANEL
CHOLESTEROL: 189 mg/dL (ref 0–200)
HDL: 58.3 mg/dL (ref >39.00)
LDL CALC: 115 mg/dL — AB (ref 0–99)
NonHDL: 130.28
TRIGLYCERIDES: 78 mg/dL (ref 0.0–149.0)
Total CHOL/HDL Ratio: 3
VLDL: 15.6 mg/dL (ref 0.0–40.0)

## 2016-05-30 LAB — CBC
HCT: 40.3 % (ref 36.0–46.0)
Hemoglobin: 13.8 g/dL (ref 12.0–15.0)
MCHC: 34.3 g/dL (ref 30.0–36.0)
MCV: 93.6 fl (ref 78.0–100.0)
Platelets: 300 10*3/uL (ref 150.0–400.0)
RBC: 4.3 Mil/uL (ref 3.87–5.11)
RDW: 13.4 % (ref 11.5–15.5)
WBC: 6 10*3/uL (ref 4.0–10.5)

## 2016-05-30 LAB — HEMOGLOBIN A1C: Hgb A1c MFr Bld: 5.3 % (ref 4.6–6.5)

## 2016-05-30 NOTE — Patient Instructions (Signed)
Health Maintenance, Female Adopting a healthy lifestyle and getting preventive care can go a long way to promote health and wellness. Talk with your health care provider about what schedule of regular examinations is right for you. This is a good chance for you to check in with your provider about disease prevention and staying healthy. In between checkups, there are plenty of things you can do on your own. Experts have done a lot of research about which lifestyle changes and preventive measures are most likely to keep you healthy. Ask your health care provider for more information. WEIGHT AND DIET  Eat a healthy diet  Be sure to include plenty of vegetables, fruits, low-fat dairy products, and lean protein.  Do not eat a lot of foods high in solid fats, added sugars, or salt.  Get regular exercise. This is one of the most important things you can do for your health.  Most adults should exercise for at least 150 minutes each week. The exercise should increase your heart rate and make you sweat (moderate-intensity exercise).  Most adults should also do strengthening exercises at least twice a week. This is in addition to the moderate-intensity exercise.  Maintain a healthy weight  Body mass index (BMI) is a measurement that can be used to identify possible weight problems. It estimates body fat based on height and weight. Your health care provider can help determine your BMI and help you achieve or maintain a healthy weight.  For females 20 years of age and older:   A BMI below 18.5 is considered underweight.  A BMI of 18.5 to 24.9 is normal.  A BMI of 25 to 29.9 is considered overweight.  A BMI of 30 and above is considered obese.  Watch levels of cholesterol and blood lipids  You should start having your blood tested for lipids and cholesterol at 44 years of age, then have this test every 5 years.  You may need to have your cholesterol levels checked more often if:  Your lipid  or cholesterol levels are high.  You are older than 44 years of age.  You are at high risk for heart disease.  CANCER SCREENING   Lung Cancer  Lung cancer screening is recommended for adults 55-80 years old who are at high risk for lung cancer because of a history of smoking.  A yearly low-dose CT scan of the lungs is recommended for people who:  Currently smoke.  Have quit within the past 15 years.  Have at least a 30-pack-year history of smoking. A pack year is smoking an average of one pack of cigarettes a day for 1 year.  Yearly screening should continue until it has been 15 years since you quit.  Yearly screening should stop if you develop a health problem that would prevent you from having lung cancer treatment.  Breast Cancer  Practice breast self-awareness. This means understanding how your breasts normally appear and feel.  It also means doing regular breast self-exams. Let your health care provider know about any changes, no matter how small.  If you are in your 20s or 30s, you should have a clinical breast exam (CBE) by a health care provider every 1-3 years as part of a regular health exam.  If you are 40 or older, have a CBE every year. Also consider having a breast X-ray (mammogram) every year.  If you have a family history of breast cancer, talk to your health care provider about genetic screening.  If you   are at high risk for breast cancer, talk to your health care provider about having an MRI and a mammogram every year.  Breast cancer gene (BRCA) assessment is recommended for women who have family members with BRCA-related cancers. BRCA-related cancers include:  Breast.  Ovarian.  Tubal.  Peritoneal cancers.  Results of the assessment will determine the need for genetic counseling and BRCA1 and BRCA2 testing. Cervical Cancer Your health care provider may recommend that you be screened regularly for cancer of the pelvic organs (ovaries, uterus, and  vagina). This screening involves a pelvic examination, including checking for microscopic changes to the surface of your cervix (Pap test). You may be encouraged to have this screening done every 3 years, beginning at age 21.  For women ages 30-65, health care providers may recommend pelvic exams and Pap testing every 3 years, or they may recommend the Pap and pelvic exam, combined with testing for human papilloma virus (HPV), every 5 years. Some types of HPV increase your risk of cervical cancer. Testing for HPV may also be done on women of any age with unclear Pap test results.  Other health care providers may not recommend any screening for nonpregnant women who are considered low risk for pelvic cancer and who do not have symptoms. Ask your health care provider if a screening pelvic exam is right for you.  If you have had past treatment for cervical cancer or a condition that could lead to cancer, you need Pap tests and screening for cancer for at least 20 years after your treatment. If Pap tests have been discontinued, your risk factors (such as having a new sexual partner) need to be reassessed to determine if screening should resume. Some women have medical problems that increase the chance of getting cervical cancer. In these cases, your health care provider may recommend more frequent screening and Pap tests. Colorectal Cancer  This type of cancer can be detected and often prevented.  Routine colorectal cancer screening usually begins at 44 years of age and continues through 44 years of age.  Your health care provider may recommend screening at an earlier age if you have risk factors for colon cancer.  Your health care provider may also recommend using home test kits to check for hidden blood in the stool.  A small camera at the end of a tube can be used to examine your colon directly (sigmoidoscopy or colonoscopy). This is done to check for the earliest forms of colorectal  cancer.  Routine screening usually begins at age 50.  Direct examination of the colon should be repeated every 5-10 years through 44 years of age. However, you may need to be screened more often if early forms of precancerous polyps or small growths are found. Skin Cancer  Check your skin from head to toe regularly.  Tell your health care provider about any new moles or changes in moles, especially if there is a change in a mole's shape or color.  Also tell your health care provider if you have a mole that is larger than the size of a pencil eraser.  Always use sunscreen. Apply sunscreen liberally and repeatedly throughout the day.  Protect yourself by wearing long sleeves, pants, a wide-brimmed hat, and sunglasses whenever you are outside. HEART DISEASE, DIABETES, AND HIGH BLOOD PRESSURE   High blood pressure causes heart disease and increases the risk of stroke. High blood pressure is more likely to develop in:  People who have blood pressure in the high end   of the normal range (130-139/85-89 mm Hg).  People who are overweight or obese.  People who are African American.  If you are 38-23 years of age, have your blood pressure checked every 3-5 years. If you are 61 years of age or older, have your blood pressure checked every year. You should have your blood pressure measured twice--once when you are at a hospital or clinic, and once when you are not at a hospital or clinic. Record the average of the two measurements. To check your blood pressure when you are not at a hospital or clinic, you can use:  An automated blood pressure machine at a pharmacy.  A home blood pressure monitor.  If you are between 45 years and 39 years old, ask your health care provider if you should take aspirin to prevent strokes.  Have regular diabetes screenings. This involves taking a blood sample to check your fasting blood sugar level.  If you are at a normal weight and have a low risk for diabetes,  have this test once every three years after 44 years of age.  If you are overweight and have a high risk for diabetes, consider being tested at a younger age or more often. PREVENTING INFECTION  Hepatitis B  If you have a higher risk for hepatitis B, you should be screened for this virus. You are considered at high risk for hepatitis B if:  You were born in a country where hepatitis B is common. Ask your health care provider which countries are considered high risk.  Your parents were born in a high-risk country, and you have not been immunized against hepatitis B (hepatitis B vaccine).  You have HIV or AIDS.  You use needles to inject street drugs.  You live with someone who has hepatitis B.  You have had sex with someone who has hepatitis B.  You get hemodialysis treatment.  You take certain medicines for conditions, including cancer, organ transplantation, and autoimmune conditions. Hepatitis C  Blood testing is recommended for:  Everyone born from [birth year] through 1964.  Anyone with known risk factors for hepatitis C. Sexually transmitted infections (STIs)  You should be screened for sexually transmitted infections (STIs) including gonorrhea and chlamydia if:  You are sexually active and are younger than 44 years of age.  You are older than 44 years of age and your health care provider tells you that you are at risk for this type of infection.  Your sexual activity has changed since you were last screened and you are at an increased risk for chlamydia or gonorrhea. Ask your health care provider if you are at risk.  If you do not have HIV, but are at risk, it may be recommended that you take a prescription medicine daily to prevent HIV infection. This is called pre-exposure prophylaxis (PrEP). You are considered at risk if:  You are sexually active and do not regularly use condoms or know the HIV status of your partner(s).  You take drugs by injection.  You are sexually  active with a partner who has HIV. Talk with your health care provider about whether you are at high risk of being infected with HIV. If you choose to begin PrEP, you should first be tested for HIV. You should then be tested every 3 months for as long as you are taking PrEP.  PREGNANCY   If you are premenopausal and you may become pregnant, ask your health care provider about preconception counseling.  If you may  become pregnant, take 400 to 800 micrograms (mcg) of folic acid every day.  If you want to prevent pregnancy, talk to your health care provider about birth control (contraception). OSTEOPOROSIS AND MENOPAUSE   Osteoporosis is a disease in which the bones lose minerals and strength with aging. This can result in serious bone fractures. Your risk for osteoporosis can be identified using a bone density scan.  If you are 61 years of age or older, or if you are at risk for osteoporosis and fractures, ask your health care provider if you should be screened.  Ask your health care provider whether you should take a calcium or vitamin D supplement to lower your risk for osteoporosis.  Menopause may have certain physical symptoms and risks.  Hormone replacement therapy may reduce some of these symptoms and risks. Talk to your health care provider about whether hormone replacement therapy is right for you.  HOME CARE INSTRUCTIONS   Schedule regular health, dental, and eye exams.  Stay current with your immunizations.   Do not use any tobacco products including cigarettes, chewing tobacco, or electronic cigarettes.  If you are pregnant, do not drink alcohol.  If you are breastfeeding, limit how much and how often you drink alcohol.  Limit alcohol intake to no more than 1 drink per day for nonpregnant women. One drink equals 12 ounces of beer, 5 ounces of wine, or 1 ounces of hard liquor.  Do not use street drugs.  Do not share needles.  Ask your health care provider for help if  you need support or information about quitting drugs.  Tell your health care provider if you often feel depressed.  Tell your health care provider if you have ever been abused or do not feel safe at home.   This information is not intended to replace advice given to you by your health care provider. Make sure you discuss any questions you have with your health care provider.   Document Released: 02/13/2011 Document Revised: 08/21/2014 Document Reviewed: 07/02/2013 Elsevier Interactive Patient Education [employer].

## 2016-05-30 NOTE — Progress Notes (Signed)
HPI  Pt presents to the clinic today to establish care. She is transferring care from Dr. Dan HumphreysWalker. She would like her annual exam today.  Flu: 04/2016 Tetanus: 11/2008 Pap Smear: 2016 at [facility] Mammogram: 06/2015 Vision Screening: annually Dentist: biannually  Diet: She does eat meat. She consumes fruits and veggies daily. She does eat some fried foods. She drinks mostly water. Exercise: She likes to work in her yard. She goes to the gym 3-4 times a week for 45 minutes to 1 hour.  No past medical history on file.  Current Outpatient Prescriptions  Medication Sig Dispense Refill  . cetirizine (ZYRTEC) 10 MG tablet Take 10 mg by mouth at bedtime.    . Pediatric Multivit-Minerals-C (MULTIVITAMIN GUMMIES CHILDRENS PO) Take by mouth.     No current facility-administered medications for this visit.     No Known Allergies  Family History  Problem Relation Age of Onset  . Seizures Father   . Diabetes Father   . Diabetes Paternal Aunt   . Cervical cancer Paternal Aunt   . Diabetes Paternal Grandmother   . Diabetes Mother   . Breast cancer Paternal Aunt     Social History   Social History  . Marital status: Married    Spouse name: N/A  . Number of children: N/A  . Years of education: N/A   Occupational History  . Not on file.   Social History Main Topics  . Smoking status: Never Smoker  . Smokeless tobacco: Never Used  . Alcohol use Yes     Comment: occasional  . Drug use: No  . Sexual activity: Not on file   Other Topics Concern  . Not on file   Social History Narrative   [place] in [place] with husband, 2 children. No pets.   Work -[employer]      Diet: regular diet   Exercise: softball    ROS:  Constitutional: Denies fever, malaise, fatigue, headache or abrupt weight changes.  HEENT: Denies eye pain, eye redness, ear pain, ringing in the ears, wax buildup, runny nose, nasal congestion, bloody nose, or sore throat. Respiratory: Denies difficulty  breathing, shortness of breath, cough or sputum production.   Cardiovascular: Denies chest pain, chest tightness, palpitations or swelling in the hands or feet.  Gastrointestinal: Pt reports occasional reflux. Denies abdominal pain, bloating, constipation, diarrhea or blood in the stool.  GU: Denies frequency, urgency, pain with urination, blood in urine, odor or discharge. Musculoskeletal: Denies decrease in range of motion, difficulty with gait, muscle pain or joint pain and swelling.  Skin: Denies redness, rashes, lesions or ulcercations.  Neurological: Denies dizziness, difficulty with memory, difficulty with speech or problems with balance and coordination.  Psych: Denies anxiety, depression, SI/HI.  No other specific complaints in a complete review of systems (except as listed in HPI above).  PE:  BP 124/70   Pulse 73   Temp 98.9 F (37.2 C) (Oral)   Ht 5' 1.75" (1.568 m)   Wt 153 lb 8 oz (69.6 kg)   LMP 11/29/2011   SpO2 99%   BMI 28.30 kg/m   Wt Readings from Last 3 Encounters:  05/30/16 153 lb 8 oz (69.6 kg)  02/09/15 162 lb (73.5 kg)  06/03/14 164 lb 12 oz (74.7 kg)    General: Appears her stated age, well developed, well nourished in NAD. HEENT: Head: normal shape and size; Eyes: sclera white, no icterus, conjunctiva pink, PERRLA and EOMs intact; Ears: Tm's gray and intact, normal light  reflex, + serous effusion bilaterally; Throat/Mouth: Teeth present, mucosa pink and moist, no lesions or ulcerations noted.  Neck: Neck supple, trachea midline. No masses, lumps or thyromegaly present.  Cardiovascular: Normal rate and rhythm. Split S2 noted.  No murmur, rubs or gallops noted. No JVD or BLE edema.  Pulmonary/Chest: Normal effort and positive vesicular breath sounds. No respiratory distress. No wheezes, rales or ronchi noted.  Abdomen: Soft and nontender. Normal bowel sounds. No distention or masses noted. Liver, spleen and kidneys non palpable. Musculoskeletal: Normal  range of motion. Strength 5/5 BUE/BLE. No signs of joint swelling. No difficulty with gait.  Neurological: Alert and oriented. Cranial nerves II-XII grossly intact. Coordination normal.  Psychiatric: Mood and affect normal. Behavior is normal. Judgment and thought content normal.     BMET    Component Value Date/Time   NA 140 12/31/2013 0812   NA 140 02/24/2013 2343   K 4.2 12/31/2013 0812   K 3.3 (L) 02/24/2013 2343   CL 107 12/31/2013 0812   CL 109 (H) 02/24/2013 2343   CO2 25 12/31/2013 0812   CO2 25 02/24/2013 2343   GLUCOSE 93 12/31/2013 0812   GLUCOSE 103 (H) 02/24/2013 2343   BUN 13 12/31/2013 0812   BUN 6 (L) 02/24/2013 2343   CREATININE 0.5 12/31/2013 0812   CREATININE 0.59 (L) 02/24/2013 2343   CALCIUM 9.4 12/31/2013 0812   CALCIUM 8.3 (L) 02/24/2013 2343   GFRNONAA >60 02/24/2013 2343   GFRAA >60 02/24/2013 2343    Lipid Panel     Component Value Date/Time   CHOL 203 (H) 12/31/2013 0812   TRIG 106.0 12/31/2013 0812   HDL 44.20 12/31/2013 0812   CHOLHDL 5 12/31/2013 0812   VLDL 21.2 12/31/2013 0812   LDLCALC 138 (H) 12/31/2013 0812    CBC    Component Value Date/Time   WBC 6.7 12/31/2013 0812   RBC 4.35 12/31/2013 0812   HGB 14.1 12/31/2013 0812   HGB 13.7 02/18/2013 1131   HCT 40.9 12/31/2013 0812   HCT 34.2 (L) 02/24/2013 2343   PLT 330.0 12/31/2013 0812   MCV 93.9 12/31/2013 0812   MCHC 34.4 12/31/2013 0812   RDW 13.0 12/31/2013 0812   LYMPHSABS 2.0 12/31/2013 0812   MONOABS 0.4 12/31/2013 0812   EOSABS 0.2 12/31/2013 0812   BASOSABS 0.0 12/31/2013 0812    Hgb A1C No results found for: HGBA1C   Assessment and Plan:  Preventative Health Maintenance:  Flu and tetanus UTD Pap smear UTD Mammogram due- she will call to schedule Encouraged her to consume a balanced diet and exercise regimen Advised her to see an eye doctor and dentist annually Will check CBC, CMET, Lipid, A1C and HIV today  RTC in 1 year, sooner if needed Nicki Reaper, NP

## 2016-05-31 LAB — HIV ANTIBODY (ROUTINE TESTING W REFLEX): HIV 1&2 Ab, 4th Generation: NONREACTIVE

## 2016-06-07 ENCOUNTER — Telehealth: Payer: Self-pay | Admitting: *Deleted

## 2016-06-07 NOTE — Telephone Encounter (Signed)
PT brought in a form to be completed. Please complete and let the pt know. [phone]. Form placed in Rx tower.

## 2016-06-09 NOTE — Telephone Encounter (Signed)
Form has been filled out, placed in your box for signature, please return to me

## 2016-06-09 NOTE — Telephone Encounter (Signed)
I called pt yesterday, and she placed me on hold for more than 1 minute... I will place in the front office for pick up

## 2016-07-12 ENCOUNTER — Telehealth: Payer: Self-pay | Admitting: Internal Medicine

## 2016-07-12 NOTE — Telephone Encounter (Signed)
PT called concerning a $20 charge for ppw for her cpe with Nicki Reaperegina Baity on 10/17. She is requesting a cb to discuss.

## 2016-08-08 ENCOUNTER — Encounter: Payer: Self-pay | Admitting: Internal Medicine

## 2016-08-08 DIAGNOSIS — K644 Residual hemorrhoidal skin tags: Secondary | ICD-10-CM

## 2016-08-08 MED ORDER — HYDROCORTISONE ACE-PRAMOXINE 2.5-1 % RE CREA: 1.0000 "application " | TOPICAL_CREAM | Freq: Three times a day (TID) | RECTAL | 0 refills | Status: DC

## 2016-08-08 NOTE — Telephone Encounter (Signed)
Rx sent electronically.  

## 2016-08-11 ENCOUNTER — Other Ambulatory Visit: Payer: Self-pay | Admitting: Internal Medicine

## 2016-08-11 DIAGNOSIS — K644 Residual hemorrhoidal skin tags: Secondary | ICD-10-CM

## 2016-08-11 MED ORDER — HYDROCORTISONE ACE-PRAMOXINE 2.5-1 % RE CREA: 1.0000 "application " | TOPICAL_CREAM | Freq: Three times a day (TID) | RECTAL | 0 refills | Status: DC

## 2016-11-24 ENCOUNTER — Telehealth: Payer: Self-pay

## 2016-11-24 MED ORDER — HYDROXYZINE HCL 10 MG PO TABS: 10.0000 mg | ORAL_TABLET | Freq: Every day | ORAL | 0 refills | Status: DC | PRN

## 2016-11-24 MED ORDER — SCOPOLAMINE 1 MG/3DAYS TD PT72: 1.0000 | MEDICATED_PATCH | TRANSDERMAL | 0 refills | Status: DC

## 2016-11-24 NOTE — Telephone Encounter (Signed)
Left detailed message on voicemail.  

## 2016-11-24 NOTE — Telephone Encounter (Signed)
rx sent.  We need 24 hour notice on there issues.   Would use the patch initially, only use the hydroxyzine if needed after that if anxious.  Wouldn't use both together initially, would see if the patch does enough w/o extra treatment.

## 2016-11-24 NOTE — Telephone Encounter (Signed)
Pt left /vm and request trans derm scope and med for nerves. Pt is flying for first time to [facility] on 11/26/16 to return in 8 days. Pt request cb 11/24/16.[facility] [address].

## 2017-05-30 ENCOUNTER — Telehealth: Payer: Self-pay | Admitting: Internal Medicine

## 2017-05-30 NOTE — Telephone Encounter (Signed)
I reviewed the charts, it is fine to schedule them back to back

## 2017-05-30 NOTE — Telephone Encounter (Signed)
Patient wants to have her physical done back to back with her husband on 07/02/17 at 10:45. Can the physicals be done back to back.

## 2017-07-02 ENCOUNTER — Encounter: Payer: Self-pay | Admitting: Internal Medicine

## 2017-07-02 ENCOUNTER — Ambulatory Visit (INDEPENDENT_AMBULATORY_CARE_PROVIDER_SITE_OTHER): Payer: BLUE CROSS/BLUE SHIELD | Admitting: Internal Medicine

## 2017-07-02 VITALS — BP 120/74 | HR 73 | Temp 98.3°F | Ht 61.75 in | Wt 157.0 lb

## 2017-07-02 DIAGNOSIS — Z0000 Encounter for general adult medical examination without abnormal findings: Secondary | ICD-10-CM | POA: Diagnosis not present

## 2017-07-02 DIAGNOSIS — Z13220 Encounter for screening for lipoid disorders: Secondary | ICD-10-CM

## 2017-07-02 LAB — LIPID PANEL
CHOL/HDL RATIO: 3
Cholesterol: 179 mg/dL (ref 0–200)
HDL: 54.4 mg/dL (ref >39.00)
LDL CALC: 107 mg/dL — AB (ref 0–99)
NonHDL: 124.55
Triglycerides: 88 mg/dL (ref 0.0–149.0)
VLDL: 17.6 mg/dL (ref 0.0–40.0)

## 2017-07-02 LAB — CBC
HCT: 41.4 % (ref 36.0–46.0)
Hemoglobin: 13.8 g/dL (ref 12.0–15.0)
MCHC: 33.3 g/dL (ref 30.0–36.0)
MCV: 97.3 fl (ref 78.0–100.0)
Platelets: 349 10*3/uL (ref 150.0–400.0)
RBC: 4.25 Mil/uL (ref 3.87–5.11)
RDW: 13.6 % (ref 11.5–15.5)
WBC: 6.5 10*3/uL (ref 4.0–10.5)

## 2017-07-02 LAB — COMPREHENSIVE METABOLIC PANEL
ALT: 16 U/L (ref 0–35)
AST: 17 U/L (ref 0–37)
Albumin: 3.9 g/dL (ref 3.5–5.2)
Alkaline Phosphatase: 61 U/L (ref 39–117)
BILIRUBIN TOTAL: 0.5 mg/dL (ref 0.2–1.2)
BUN: 10 mg/dL (ref 6–23)
CHLORIDE: 106 meq/L (ref 96–112)
CO2: 27 meq/L (ref 19–32)
Calcium: 9 mg/dL (ref 8.4–10.5)
Creatinine, Ser: 0.54 mg/dL (ref 0.40–1.20)
GFR: 129.4 mL/min (ref >60.00)
GLUCOSE: 98 mg/dL (ref 70–99)
Potassium: 3.8 mEq/L (ref 3.5–5.1)
Sodium: 140 mEq/L (ref 135–145)
Total Protein: 6.6 g/dL (ref 6.0–8.3)

## 2017-07-02 NOTE — Patient Instructions (Signed)

## 2017-07-02 NOTE — Progress Notes (Signed)
Subjective:    Patient ID: Kristina Pena, female    DOB: 03/08/1971, 46 y.o.   MRN: [id number]  HPI  Pt presents to the clinic today for her annual exam.  Flu: 05/2017 Tetanus: 11/2008 Pap Smear: 2017 [facility] Mammogram: 06/2015 Vision Screening: annually Dentist: biannually   Diet: She does eat meat. She consumes fruits and veggies daily. She drinks mostly water, soda and beer. Exercise: Walking 5 days a week for 1-2 miles.  Review of Systems      No past medical history on file.  Current Outpatient Medications  Medication Sig Dispense Refill  . cetirizine (ZYRTEC) 10 MG tablet Take 10 mg by mouth at bedtime.    . hydrocortisone-pramoxine (ANALPRAM HC) 2.5-1 % rectal cream Place 1 application rectally 3 (three) times daily. 30 g 0  . hydrOXYzine (ATARAX/VISTARIL) 10 MG tablet Take 1 tablet (10 mg total) by mouth daily as needed for anxiety. 4 tablet 0  . Pediatric Multivit-Minerals-C (MULTIVITAMIN GUMMIES CHILDRENS PO) Take by mouth.    Marland Kitchen. scopolamine (TRANSDERM-SCOP, 1.5 MG,) 1 MG/3DAYS Place 1 patch (1.5 mg total) onto the skin every 3 (three) days. 4 patch 0   No current facility-administered medications for this visit.     No Known Allergies  Family History  Problem Relation Age of Onset  . Seizures Father   . Diabetes Father   . Diabetes Paternal Aunt   . Cervical cancer Paternal Aunt   . Diabetes Paternal Grandmother   . Diabetes Mother   . Breast cancer Paternal Aunt     Social History   Socioeconomic History  . Marital status: Married    Spouse name: Not on file  . Number of children: Not on file  . Years of education: Not on file  . Highest education level: Not on file  Social Needs  . Financial resource strain: Not on file  . Food insecurity - worry: Not on file  . Food insecurity - inability: Not on file  . Transportation needs - medical: Not on file  . Transportation needs - non-medical: Not on file  Occupational History  . Not on file   Tobacco Use  . Smoking status: Never Smoker  . Smokeless tobacco: Never Used  Substance and Sexual Activity  . Alcohol use: Yes    Comment: occasional  . Drug use: No  . Sexual activity: Yes    Birth control/protection: Surgical  Other Topics Concern  . Not on file  Social History Narrative   [place], 2 children. No pets.   Work -[employer] insurance      Diet: regular diet   Exercise: softball     Constitutional: Denies fever, malaise, fatigue, headache or abrupt weight changes.  HEENT: Denies eye pain, eye redness, ear pain, ringing in the ears, wax buildup, runny nose, nasal congestion, bloody nose, or sore throat. Respiratory: Denies difficulty breathing, shortness of breath, cough or sputum production.   Cardiovascular: Denies chest pain, chest tightness, palpitations or swelling in the hands or feet.  Gastrointestinal: Pt reports internal hemorrhoids. Denies abdominal pain, bloating, constipation, diarrhea or blood in the stool.  GU: Denies urgency, frequency, pain with urination, burning sensation, blood in urine, odor or discharge. Musculoskeletal: Denies decrease in range of motion, difficulty with gait, muscle pain or joint pain and swelling.  Skin: Denies redness, rashes, lesions or ulcercations.  Neurological: Denies dizziness, difficulty with memory, difficulty with speech or problems with balance and coordination.  Psych: Denies anxiety, depression,  SI/HI.  No other specific complaints in a complete review of systems (except as listed in HPI above).  Objective:   Physical Exam   BP 120/74   Pulse 73   Temp 98.3 F (36.8 C) (Oral)   Ht 5' 1.75" (1.568 m)   Wt 157 lb (71.2 kg)   LMP 11/29/2011   SpO2 98%   BMI 28.95 kg/m  Wt Readings from Last 3 Encounters:  07/02/17 157 lb (71.2 kg)  05/30/16 153 lb 8 oz (69.6 kg)  02/09/15 162 lb (73.5 kg)    General: Appears her stated age, well developed, well nourished in NAD. Skin: Warm, dry  and intact. HEENT: Head: normal shape and size; Eyes: sclera white, no icterus, conjunctiva pink, PERRLA and EOMs intact; Ears: Tm's gray and intact, normal light reflex; Throat/Mouth: Teeth present, mucosa pink and moist, no exudate, lesions or ulcerations noted.  Neck:  Neck supple, trachea midline. No masses, lumps or thyromegaly present.  Cardiovascular: Normal rate and rhythm. S1,S2 noted.  No murmur, rubs or gallops noted. No JVD or BLE edema.  Pulmonary/Chest: Normal effort and positive vesicular breath sounds. No respiratory distress. No wheezes, rales or ronchi noted.  Abdomen: Soft and nontender. Normal bowel sounds. No distention or masses noted. Liver, spleen and kidneys non palpable. Musculoskeletal: Strength 5/5 BUE/BLE. No difficulty with gait.  Neurological: Alert and oriented. Cranial nerves II-XII grossly intact. Coordination normal.  Psychiatric: Mood and affect normal. Behavior is normal. Judgment and thought content normal.    BMET    Component Value Date/Time   NA 142 05/30/2016 1127   NA 140 02/24/2013 2343   K 4.4 05/30/2016 1127   K 3.3 (L) 02/24/2013 2343   CL 107 05/30/2016 1127   CL 109 (H) 02/24/2013 2343   CO2 28 05/30/2016 1127   CO2 25 02/24/2013 2343   GLUCOSE 91 05/30/2016 1127   GLUCOSE 103 (H) 02/24/2013 2343   BUN 10 05/30/2016 1127   BUN 6 (L) 02/24/2013 2343   CREATININE 0.60 05/30/2016 1127   CREATININE 0.59 (L) 02/24/2013 2343   CALCIUM 9.5 05/30/2016 1127   CALCIUM 8.3 (L) 02/24/2013 2343   GFRNONAA >60 02/24/2013 2343   GFRAA >60 02/24/2013 2343    Lipid Panel     Component Value Date/Time   CHOL 189 05/30/2016 1127   TRIG 78.0 05/30/2016 1127   HDL 58.30 05/30/2016 1127   CHOLHDL 3 05/30/2016 1127   VLDL 15.6 05/30/2016 1127   LDLCALC 115 (H) 05/30/2016 1127    CBC    Component Value Date/Time   WBC 6.0 05/30/2016 1127   RBC 4.30 05/30/2016 1127   HGB 13.8 05/30/2016 1127   HGB 13.7 02/18/2013 1131   HCT 40.3 05/30/2016  1127   HCT 34.2 (L) 02/24/2013 2343   PLT 300.0 05/30/2016 1127   MCV 93.6 05/30/2016 1127   MCHC 34.3 05/30/2016 1127   RDW 13.4 05/30/2016 1127   LYMPHSABS 2.0 12/31/2013 0812   MONOABS 0.4 12/31/2013 0812   EOSABS 0.2 12/31/2013 0812   BASOSABS 0.0 12/31/2013 0812    Hgb A1C Lab Results  Component Value Date   HGBA1C 5.3 05/30/2016           Assessment & Plan:   Preventative Health Maintenance:  Flu shot UTD Tetanus UTD Pap smear UTD, we will request copy of records She declines mammogram at this time Encouraged her to consume a balanced diet and exercise regimen Advised her to see an eye doctor and dentist annually  Will check CBC, CMET and lipid profile today  RTC in 1 year, sooner if needed Nicki Reaper, NP

## 2018-03-14 ENCOUNTER — Other Ambulatory Visit: Payer: Self-pay | Admitting: Orthopedic Surgery

## 2018-03-14 DIAGNOSIS — M25511 Pain in right shoulder: Secondary | ICD-10-CM

## 2018-03-24 ENCOUNTER — Ambulatory Visit
Admission: RE | Admit: 2018-03-24 | Discharge: 2018-03-24 | Disposition: A | Discharge status: Home / Self Care | Payer: BLUE CROSS/BLUE SHIELD | Source: Ambulatory Visit | Attending: Orthopedic Surgery | Admitting: Orthopedic Surgery

## 2018-03-24 DIAGNOSIS — M25511 Pain in right shoulder: Secondary | ICD-10-CM | POA: Insufficient documentation

## 2018-03-24 DIAGNOSIS — X58XXXA Exposure to other specified factors, initial encounter: Secondary | ICD-10-CM | POA: Diagnosis not present

## 2018-03-24 DIAGNOSIS — S46811A Strain of other muscles, fascia and tendons at shoulder and upper arm level, right arm, initial encounter: Secondary | ICD-10-CM | POA: Diagnosis not present

## 2018-03-28 ENCOUNTER — Ambulatory Visit: Payer: BLUE CROSS/BLUE SHIELD

## 2018-04-29 ENCOUNTER — Other Ambulatory Visit: Payer: Self-pay

## 2018-04-29 ENCOUNTER — Encounter
Admission: RE | Admit: 2018-04-29 | Discharge: 2018-04-29 | Disposition: A | Discharge status: Home / Self Care | Payer: BLUE CROSS/BLUE SHIELD | Source: Ambulatory Visit | Attending: Orthopedic Surgery | Admitting: Orthopedic Surgery

## 2018-04-29 HISTORY — DX: Other complications of anesthesia, initial encounter: T88.59XA

## 2018-04-29 HISTORY — DX: Gastro-esophageal reflux disease without esophagitis: K21.9

## 2018-04-29 HISTORY — DX: Adverse effect of unspecified anesthetic, initial encounter: T41.45XA

## 2018-04-29 NOTE — Patient Instructions (Signed)
Your procedure is scheduled on: 05-06-18 Report to Same Day Surgery 2nd floor medical mall [facility](Medical Mall Entrance-take elevator on left to 2nd floor.  Check in with surgery information desk.) To find out your arrival time please call [phone] between 1PM - 3PM on 05-03-18   Remember: Instructions that are not followed completely may result in serious medical risk, up to and including death, or upon the discretion of your surgeon and anesthesiologist your surgery may need to be rescheduled.    _x___ 1. Do not eat food after midnight the night before your procedure. NO GUM OR CANDY AFTER MIDNIGHT.  You may drink clear liquids up to 2 hours before you are scheduled to arrive at the hospital for your procedure.  Do not drink clear liquids within 2 hours of your scheduled arrival to the hospital.  Clear liquids include  --Water or Apple juice without pulp  --Clear carbohydrate beverage such as ClearFast or Gatorade  --Black Coffee or Clear Tea (No milk, no creamers, do not add anything to the coffee or Tea   ____Ensure clear carbohydrate drink on the way to the hospital for bariatric patients  ____Ensure clear carbohydrate drink 3 hours before surgery for Dr Rutherford NailByrnett's patients if physician instructed.     __x__ 2. No Alcohol for 24 hours before or after surgery.   __x__3. No Smoking or e-cigarettes for 24 prior to surgery.  Do not use any chewable tobacco products for at least 6 hour prior to surgery   ____  4. Bring all medications with you on the day of surgery if instructed.    __x__ 5. Notify your doctor if there is any change in your medical condition     (cold, fever, infections).    x___6. On the morning of surgery brush your teeth with toothpaste and water.  You may rinse your mouth with mouth wash if you wish.  Do not swallow any toothpaste or mouthwash.   Do not wear jewelry, make-up, hairpins, clips or nail polish.  Do not wear lotions, powders, or perfumes. You may wear  deodorant.  Do not shave 48 hours prior to surgery. Men may shave face and neck.  Do not bring valuables to the hospital.    [facility] is not responsible for any belongings or valuables.               Contacts, dentures or bridgework may not be worn into surgery.  Leave your suitcase in the car. After surgery it may be brought to your room.  For patients admitted to the hospital, discharge time is determined by your treatment team.  _  Patients discharged the day of surgery will not be allowed to drive home.  You will need someone to drive you home and stay with you the night of your procedure.    Please read over the following fact sheets that you were given:   [facility] Pre[facility]   ____ Take anti-hypertensive listed below, cardiac, seizure, asthma,anti-reflux and psychiatric medicines. These include:  1. NONE  2.  3.  4.  5.  6.  ____Fleets enema or Magnesium Citrate as directed.   ____ Use CHG Soap or sage wipes as directed on instruction sheet   ____ Use inhalers on the day of surgery and bring to hospital day of surgery  ____ Stop Metformin and Janumet 2 days prior to surgery.    ____ Take 1/2 of usual insulin dose the night before surgery and none on the  morning surgery.   ____ Follow recommendations from Cardiologist, Pulmonologist or PCP regarding stopping Aspirin, Coumadin, Plavix ,Eliquis, Effient, or Pradaxa, and Pletal.  X____Stop Anti-inflammatories such as Advil, Aleve, Ibuprofen, Motrin, Naproxen, Naprosyn, Goodies powders or aspirin products NOW-OK to take Tylenol    ____ Stop supplements until after surgery.     ____ Bring C-Pap to the hospital.

## 2018-05-05 MED ORDER — CEFAZOLIN SODIUM-DEXTROSE 2-4 GM/100ML-% IV SOLN
2.0000 g | Freq: Once | INTRAVENOUS | Status: AC
  Administered 2018-05-06: 2 g via INTRAVENOUS

## 2018-05-06 ENCOUNTER — Other Ambulatory Visit: Payer: Self-pay

## 2018-05-06 ENCOUNTER — Ambulatory Visit
Admission: RE | Admit: 2018-05-06 | Discharge: 2018-05-06 | Disposition: A | Discharge status: Home / Self Care | Payer: BLUE CROSS/BLUE SHIELD | Source: Ambulatory Visit | Attending: Orthopedic Surgery | Admitting: Orthopedic Surgery

## 2018-05-06 ENCOUNTER — Ambulatory Visit: Payer: BLUE CROSS/BLUE SHIELD | Admitting: Anesthesiology

## 2018-05-06 ENCOUNTER — Encounter
Admission: RE | Disposition: A | Discharge status: Home / Self Care | Payer: Self-pay | Source: Ambulatory Visit | Attending: Orthopedic Surgery

## 2018-05-06 ENCOUNTER — Encounter: Payer: Self-pay | Admitting: Emergency Medicine

## 2018-05-06 DIAGNOSIS — Y92818 Other transport vehicle as the place of occurrence of the external cause: Secondary | ICD-10-CM | POA: Diagnosis not present

## 2018-05-06 DIAGNOSIS — X58XXXA Exposure to other specified factors, initial encounter: Secondary | ICD-10-CM | POA: Insufficient documentation

## 2018-05-06 DIAGNOSIS — J45909 Unspecified asthma, uncomplicated: Secondary | ICD-10-CM | POA: Diagnosis not present

## 2018-05-06 DIAGNOSIS — M19011 Primary osteoarthritis, right shoulder: Secondary | ICD-10-CM | POA: Diagnosis not present

## 2018-05-06 DIAGNOSIS — W010XXA Fall on same level from slipping, tripping and stumbling without subsequent striking against object, initial encounter: Secondary | ICD-10-CM | POA: Insufficient documentation

## 2018-05-06 DIAGNOSIS — M7541 Impingement syndrome of right shoulder: Secondary | ICD-10-CM | POA: Diagnosis not present

## 2018-05-06 DIAGNOSIS — M7521 Bicipital tendinitis, right shoulder: Secondary | ICD-10-CM | POA: Diagnosis not present

## 2018-05-06 DIAGNOSIS — E559 Vitamin D deficiency, unspecified: Secondary | ICD-10-CM | POA: Insufficient documentation

## 2018-05-06 DIAGNOSIS — Z79899 Other long term (current) drug therapy: Secondary | ICD-10-CM | POA: Diagnosis not present

## 2018-05-06 DIAGNOSIS — K219 Gastro-esophageal reflux disease without esophagitis: Secondary | ICD-10-CM | POA: Insufficient documentation

## 2018-05-06 DIAGNOSIS — E538 Deficiency of other specified B group vitamins: Secondary | ICD-10-CM | POA: Diagnosis not present

## 2018-05-06 DIAGNOSIS — S43431A Superior glenoid labrum lesion of right shoulder, initial encounter: Secondary | ICD-10-CM | POA: Insufficient documentation

## 2018-05-06 DIAGNOSIS — S46011A Strain of muscle(s) and tendon(s) of the rotator cuff of right shoulder, initial encounter: Secondary | ICD-10-CM | POA: Diagnosis present

## 2018-05-06 DIAGNOSIS — Y9389 Activity, other specified: Secondary | ICD-10-CM | POA: Insufficient documentation

## 2018-05-06 HISTORY — DX: Failed or difficult intubation, initial encounter: T88.4XXA

## 2018-05-06 HISTORY — PX: SHOULDER ARTHROSCOPY WITH OPEN ROTATOR CUFF REPAIR: SHX6092

## 2018-05-06 SURGERY — ARTHROSCOPY, SHOULDER WITH REPAIR, ROTATOR CUFF, OPEN
Anesthesia: General | Laterality: Right

## 2018-05-06 MED ORDER — LIDOCAINE HCL (PF) 1 % IJ SOLN
INTRAMUSCULAR | Status: AC
  Filled 2018-05-06: qty 5

## 2018-05-06 MED ORDER — FENTANYL CITRATE (PF) 100 MCG/2ML IJ SOLN
INTRAMUSCULAR | Status: DC | PRN
  Administered 2018-05-06: 100 ug via INTRAVENOUS

## 2018-05-06 MED ORDER — DEXAMETHASONE SODIUM PHOSPHATE 10 MG/ML IJ SOLN
INTRAMUSCULAR | Status: DC | PRN
  Administered 2018-05-06 (×2): 10 mg via INTRAVENOUS

## 2018-05-06 MED ORDER — ACETAMINOPHEN 500 MG PO TABS: 1000.0000 mg | ORAL_TABLET | Freq: Three times a day (TID) | ORAL | 2 refills | Status: AC

## 2018-05-06 MED ORDER — MIDAZOLAM HCL 2 MG/2ML IJ SOLN
INTRAMUSCULAR | Status: AC
  Administered 2018-05-06: 1 mg via INTRAVENOUS
  Filled 2018-05-06: qty 2

## 2018-05-06 MED ORDER — PROPOFOL 10 MG/ML IV BOLUS
INTRAVENOUS | Status: AC
  Filled 2018-05-06: qty 20

## 2018-05-06 MED ORDER — PROMETHAZINE HCL 25 MG/ML IJ SOLN
12.5000 mg | INTRAMUSCULAR | Status: DC | PRN
  Administered 2018-05-06: 12.5 mg via INTRAVENOUS

## 2018-05-06 MED ORDER — PROMETHAZINE HCL 25 MG/ML IJ SOLN
INTRAMUSCULAR | Status: AC
  Administered 2018-05-06: 12.5 mg via INTRAVENOUS
  Filled 2018-05-06: qty 1

## 2018-05-06 MED ORDER — SODIUM CHLORIDE 0.9 % IV SOLN
INTRAVENOUS | Status: DC | PRN
  Administered 2018-05-06: 15 ug/min via INTRAVENOUS

## 2018-05-06 MED ORDER — SUGAMMADEX SODIUM 200 MG/2ML IV SOLN
INTRAVENOUS | Status: DC | PRN
  Administered 2018-05-06: 200 mg via INTRAVENOUS

## 2018-05-06 MED ORDER — FENTANYL CITRATE (PF) 100 MCG/2ML IJ SOLN: 25.0000 ug | INTRAMUSCULAR | Status: DC | PRN

## 2018-05-06 MED ORDER — ONDANSETRON HCL 4 MG/2ML IJ SOLN
INTRAMUSCULAR | Status: DC | PRN
  Administered 2018-05-06: 4 mg via INTRAVENOUS

## 2018-05-06 MED ORDER — FAMOTIDINE 20 MG PO TABS
20.0000 mg | ORAL_TABLET | Freq: Once | ORAL | Status: AC
  Administered 2018-05-06: 20 mg via ORAL

## 2018-05-06 MED ORDER — BUPIVACAINE LIPOSOME 1.3 % IJ SUSP
INTRAMUSCULAR | Status: AC
  Filled 2018-05-06: qty 20

## 2018-05-06 MED ORDER — SEVOFLURANE IN SOLN
RESPIRATORY_TRACT | Status: AC
  Filled 2018-05-06: qty 250

## 2018-05-06 MED ORDER — BUPIVACAINE LIPOSOME 1.3 % IJ SUSP
INTRAMUSCULAR | Status: DC | PRN
  Administered 2018-05-06: 13 mL via PERINEURAL
  Administered 2018-05-06: 7 mL via PERINEURAL

## 2018-05-06 MED ORDER — FENTANYL CITRATE (PF) 100 MCG/2ML IJ SOLN
50.0000 ug | Freq: Once | INTRAMUSCULAR | Status: AC
  Administered 2018-05-06: 50 ug via INTRAVENOUS

## 2018-05-06 MED ORDER — MIDAZOLAM HCL 2 MG/2ML IJ SOLN
1.0000 mg | Freq: Once | INTRAMUSCULAR | Status: AC
  Administered 2018-05-06: 1 mg via INTRAVENOUS

## 2018-05-06 MED ORDER — FENTANYL CITRATE (PF) 100 MCG/2ML IJ SOLN
INTRAMUSCULAR | Status: AC
  Filled 2018-05-06: qty 4

## 2018-05-06 MED ORDER — ROCURONIUM BROMIDE 100 MG/10ML IV SOLN
INTRAVENOUS | Status: DC | PRN
  Administered 2018-05-06: 50 mg via INTRAVENOUS
  Administered 2018-05-06: 10 mg via INTRAVENOUS

## 2018-05-06 MED ORDER — LACTATED RINGERS IV SOLN
INTRAVENOUS | Status: DC
  Administered 2018-05-06 (×2): via INTRAVENOUS

## 2018-05-06 MED ORDER — ACETAMINOPHEN 10 MG/ML IV SOLN
INTRAVENOUS | Status: AC
  Filled 2018-05-06: qty 100

## 2018-05-06 MED ORDER — SODIUM CHLORIDE 0.9 % IV SOLN: INTRAVENOUS | Status: DC | PRN

## 2018-05-06 MED ORDER — ACETAMINOPHEN 10 MG/ML IV SOLN: 1000.0000 mg | Freq: Once | INTRAVENOUS | Status: DC

## 2018-05-06 MED ORDER — ROCURONIUM BROMIDE 50 MG/5ML IV SOLN
INTRAVENOUS | Status: AC
  Filled 2018-05-06: qty 1

## 2018-05-06 MED ORDER — LIDOCAINE HCL (CARDIAC) PF 100 MG/5ML IV SOSY
PREFILLED_SYRINGE | INTRAVENOUS | Status: DC | PRN
  Administered 2018-05-06: 60 mg via INTRAVENOUS

## 2018-05-06 MED ORDER — LIDOCAINE-EPINEPHRINE 1 %-1:100000 IJ SOLN
INTRAMUSCULAR | Status: AC
  Filled 2018-05-06: qty 1

## 2018-05-06 MED ORDER — BUPIVACAINE HCL (PF) 0.5 % IJ SOLN
INTRAMUSCULAR | Status: AC
  Filled 2018-05-06: qty 10

## 2018-05-06 MED ORDER — PROPOFOL 10 MG/ML IV BOLUS
INTRAVENOUS | Status: DC | PRN
  Administered 2018-05-06: 50 mg via INTRAVENOUS
  Administered 2018-05-06: 20 mg via INTRAVENOUS
  Administered 2018-05-06: 140 mg via INTRAVENOUS
  Administered 2018-05-06: 40 mg via INTRAVENOUS
  Administered 2018-05-06: 100 mg via INTRAVENOUS

## 2018-05-06 MED ORDER — EPINEPHRINE 30 MG/30ML IJ SOLN
INTRAMUSCULAR | Status: AC
  Filled 2018-05-06: qty 1

## 2018-05-06 MED ORDER — OXYCODONE HCL 5 MG PO TABS: 5.0000 mg | ORAL_TABLET | Freq: Once | ORAL | Status: DC | PRN

## 2018-05-06 MED ORDER — PHENOL 1.4 % MT LIQD
2.0000 | OROMUCOSAL | Status: DC | PRN
  Filled 2018-05-06: qty 177

## 2018-05-06 MED ORDER — BUPIVACAINE HCL (PF) 0.5 % IJ SOLN
INTRAMUSCULAR | Status: DC | PRN
  Administered 2018-05-06: 3 mL via PERINEURAL
  Administered 2018-05-06: 7 mL via PERINEURAL

## 2018-05-06 MED ORDER — OXYCODONE HCL 5 MG/5ML PO SOLN: 5.0000 mg | Freq: Once | ORAL | Status: DC | PRN

## 2018-05-06 MED ORDER — PHENYLEPHRINE HCL 10 MG/ML IJ SOLN
INTRAMUSCULAR | Status: DC | PRN
  Administered 2018-05-06 (×2): 100 ug via INTRAVENOUS
  Administered 2018-05-06: 50 ug via INTRAVENOUS
  Administered 2018-05-06: 200 ug via INTRAVENOUS

## 2018-05-06 MED ORDER — ONDANSETRON 4 MG PO TBDP: 4.0000 mg | ORAL_TABLET | Freq: Three times a day (TID) | ORAL | 0 refills | Status: AC | PRN

## 2018-05-06 MED ORDER — LIDOCAINE-EPINEPHRINE 2 %-1:100000 IJ SOLN
INTRAMUSCULAR | Status: AC
  Filled 2018-05-06: qty 1

## 2018-05-06 MED ORDER — LACTATED RINGERS IV SOLN
INTRAVENOUS | Status: DC | PRN
  Administered 2018-05-06: 12 mL

## 2018-05-06 MED ORDER — OXYCODONE HCL 5 MG PO TABS: 5.0000 mg | ORAL_TABLET | ORAL | 0 refills | Status: AC | PRN

## 2018-05-06 MED ORDER — ASPIRIN EC 325 MG PO TBEC: 325.0000 mg | DELAYED_RELEASE_TABLET | Freq: Every day | ORAL | 0 refills | Status: AC

## 2018-05-06 MED ORDER — SODIUM CHLORIDE FLUSH 0.9 % IV SOLN
INTRAVENOUS | Status: AC
  Filled 2018-05-06: qty 10

## 2018-05-06 MED ORDER — FENTANYL CITRATE (PF) 100 MCG/2ML IJ SOLN
INTRAMUSCULAR | Status: AC
  Administered 2018-05-06: 50 ug via INTRAVENOUS
  Filled 2018-05-06: qty 2

## 2018-05-06 MED ORDER — CEFAZOLIN SODIUM-DEXTROSE 2-4 GM/100ML-% IV SOLN
INTRAVENOUS | Status: AC
  Filled 2018-05-06: qty 100

## 2018-05-06 MED ORDER — FAMOTIDINE 20 MG PO TABS
ORAL_TABLET | ORAL | Status: AC
  Administered 2018-05-06: 20 mg via ORAL
  Filled 2018-05-06: qty 1

## 2018-05-06 SURGICAL SUPPLY — 79 items
ADAPTER IRRIG TUBE 2 SPIKE SOL (ADAPTER) ×6 IMPLANT
ANCHOR ALL-SUT Q-FIX 2.8 (Anchor) ×3 IMPLANT
ANCHOR TENDON REGENETEN (Staple) ×3 IMPLANT
ANCHORS BONE REGENETEN (Anchor) ×3 IMPLANT
BLADE OSCILLATING/SAGITTAL (BLADE)
BLADE SW THK.38XMED LNG THN (BLADE) IMPLANT
BUR BR 5.5 12 FLUTE (BURR) IMPLANT
BUR RADIUS 4.0X18.5 (BURR) IMPLANT
CANNULA 5.75X7CM (CANNULA)
CANNULA PART THRD DISP 5.75X7 (CANNULA) IMPLANT
CANNULA PARTIAL THREAD 2X7 (CANNULA) IMPLANT
CANNULA TWIST IN 8.25X9CM (CANNULA) ×3 IMPLANT
CHLORAPREP W/TINT 26ML (MISCELLANEOUS) ×3 IMPLANT
CLOSURE WOUND 1/2 X4 (GAUZE/BANDAGES/DRESSINGS)
COOLER POLAR GLACIER W/PUMP (MISCELLANEOUS) ×3 IMPLANT
COVER LIGHT HANDLE STERIS (MISCELLANEOUS) ×3 IMPLANT
CRADLE LAMINECT ARM (MISCELLANEOUS) ×6 IMPLANT
DERMABOND ADVANCED (GAUZE/BANDAGES/DRESSINGS)
DERMABOND ADVANCED .7 DNX12 (GAUZE/BANDAGES/DRESSINGS) IMPLANT
DRAPE IMP U-DRAPE 54X76 (DRAPES) ×6 IMPLANT
DRAPE INCISE IOBAN 66X45 STRL (DRAPES) ×3 IMPLANT
DRAPE SHEET LG 3/4 BI-LAMINATE (DRAPES) ×3 IMPLANT
DRAPE STERI 35X30 U-POUCH (DRAPES) ×3 IMPLANT
DRAPE U-SHAPE 47X51 STRL (DRAPES) ×3 IMPLANT
ELECT REM PT RETURN 9FT ADLT (ELECTROSURGICAL) ×3
ELECTRODE REM PT RTRN 9FT ADLT (ELECTROSURGICAL) ×1 IMPLANT
GAUZE PETRO XEROFOAM 1X8 (MISCELLANEOUS) ×3 IMPLANT
GAUZE SPONGE 4X4 12PLY STRL (GAUZE/BANDAGES/DRESSINGS) ×3 IMPLANT
GLOVE BIOGEL PI IND STRL 8 (GLOVE) ×1 IMPLANT
GLOVE BIOGEL PI INDICATOR 8 (GLOVE) ×2
GLOVE SURG SYN 8.0 (GLOVE) ×6 IMPLANT
GOWN STRL REUS W/ TWL LRG LVL3 (GOWN DISPOSABLE) ×1 IMPLANT
GOWN STRL REUS W/TWL LRG LVL3 (GOWN DISPOSABLE) ×2
GOWN STRL REUS W/TWL LRG LVL4 (GOWN DISPOSABLE) ×3 IMPLANT
IMPLANT REGENETEN MEDIUM (Shoulder) ×3 IMPLANT
IV LACTATED RINGER IRRG 3000ML (IV SOLUTION) ×8
IV LR IRRIG 3000ML ARTHROMATIC (IV SOLUTION) ×4 IMPLANT
KIT STABILIZATION SHOULDER (MISCELLANEOUS) ×3 IMPLANT
KIT SUTURE 2.8 Q-FIX DISP (MISCELLANEOUS) ×3 IMPLANT
KIT TURNOVER KIT A (KITS) ×3 IMPLANT
MANIFOLD NEPTUNE II (INSTRUMENTS) ×9 IMPLANT
MASK FACE SPIDER DISP (MASK) ×3 IMPLANT
MAT ABSORB  FLUID 56X50 GRAY (MISCELLANEOUS) ×4
MAT ABSORB FLUID 56X50 GRAY (MISCELLANEOUS) ×2 IMPLANT
NDL SAFETY ECLIPSE 18X1.5 (NEEDLE) ×1 IMPLANT
NEEDLE HYPO 18GX1.5 SHARP (NEEDLE) ×2
NEEDLE HYPO 22GX1.5 SAFETY (NEEDLE) ×3 IMPLANT
NEEDLE MAYO 6 CRC TAPER PT (NEEDLE) IMPLANT
PACK ARTHROSCOPY SHOULDER (MISCELLANEOUS) ×3 IMPLANT
PAD ABD DERMACEA PRESS 5X9 (GAUZE/BANDAGES/DRESSINGS) ×3 IMPLANT
PAD WRAPON POLAR SHDR XLG (MISCELLANEOUS) ×1 IMPLANT
SET TUBE SUCT SHAVER OUTFL 24K (TUBING) ×3 IMPLANT
SET TUBE TIP INTRA-ARTICULAR (MISCELLANEOUS) ×3 IMPLANT
SLING ULTRA II M (MISCELLANEOUS) IMPLANT
SPONGE LAP 18X18 RF (DISPOSABLE) ×3 IMPLANT
STAPLER SKIN PROX 35W (STAPLE) IMPLANT
STRAP SAFETY 5IN WIDE (MISCELLANEOUS) ×3 IMPLANT
STRIP CLOSURE SKIN 1/2X4 (GAUZE/BANDAGES/DRESSINGS) IMPLANT
SUT ETHILON 3-0 (SUTURE) IMPLANT
SUT ETHILON 4-0 (SUTURE) ×2
SUT ETHILON 4-0 FS2 18XMFL BLK (SUTURE) ×1
SUT LASSO 90 DEG SD STR (SUTURE) IMPLANT
SUT MNCRL 4-0 (SUTURE)
SUT MNCRL 4-0 27XMFL (SUTURE)
SUT PROLENE 0 CT 2 (SUTURE) IMPLANT
SUT TICRON 2-0 30IN 311381 (SUTURE) IMPLANT
SUT VIC AB 0 CT1 36 (SUTURE) IMPLANT
SUT VIC AB 2-0 CT2 27 (SUTURE) IMPLANT
SUT VICRYL 3-0 27IN (SUTURE) IMPLANT
SUTURE ETHLN 4-0 FS2 18XMF BLK (SUTURE) ×1 IMPLANT
SUTURE MNCRL 4-0 27XMF (SUTURE) IMPLANT
SYR 10ML LL (SYRINGE) ×3 IMPLANT
TAPE CLOTH 3X10 WHT NS LF (GAUZE/BANDAGES/DRESSINGS) ×3 IMPLANT
TAPE MICROFOAM 4IN (TAPE) ×3 IMPLANT
TUBING ARTHRO INFLOW-ONLY STRL (TUBING) ×3 IMPLANT
TUBING CONNECTING 10 (TUBING) ×2 IMPLANT
TUBING CONNECTING 10' (TUBING) ×1
WAND HAND CNTRL MULTIVAC 90 (MISCELLANEOUS) ×3 IMPLANT
WRAPON POLAR PAD SHDR XLG (MISCELLANEOUS) ×3

## 2018-05-06 NOTE — Discharge Instructions (Signed)
Post-Op Instructions - Arthroscopic Shoulder Surgery with Biceps Tenodesis ° °1. Bracing: You will wear a shoulder immobilizer or sling for 4 weeks.  ° °2. Driving: When driving, do not wear the immobilizer. Ideally, we recommend no driving for 4 weeks while sling is in place as one arm will be immobilized.  ° °3. Activity: No active lifting for 6 weeks. Wrist, hand, and elbow motion only. You are permitted to bend and straighten the elbow passively only (no active elbow motion). You may use your hand and wrist for typing, writing, and managing utensils (cutting food). Do not lift more than a coffee cup for 8 weeks.  When sleeping or resting, inclined positions (recliner chair or wedge pillow) and a pillow under the forearm for support may provide better comfort for up to 4 weeks.  Avoid long distance travel for 4 weeks. ° °Return to normal activities normally takes 4 months on average. If rehab goes very well, may be able to do most activities at 3 months, except overhead or contact sports. ° °4. Physical Therapy: Begins 3-4 days after surgery, and proceed 1 time per week for the first 6 weeks, then 1-2 times per week from weeks 6-20 post-op. ° °5. Medications:  °- You will be provided a prescription for narcotic pain medicine. After surgery, take 1-2 narcotic tablets every 4 hours if needed for severe pain.  °- A prescription for anti-nausea medication will be provided in case the narcotic medicine causes nausea - take 1 tablet every 6 hours only if nauseated.   °- Take tylenol 1000 mg (2 Extra Strength tablets or 3 regular strength) every 8 hours for pain.  May decrease or stop tylenol 5 days after surgery if you are having minimal pain. °- Take ASA 325mg/day x 2 weeks to help prevent DVTs/PEs (blood clots).  ° ° °If you are taking prescription medication for anxiety, depression, insomnia, muscle spasm, chronic pain, or for attention deficit disorder, you are advised that you are at a higher risk of adverse  effects with use of narcotics post-op, including narcotic addiction/dependence, depressed breathing, death. °If you use non-prescribed substances: alcohol, marijuana, cocaine, heroin, methamphetamines, etc., you are at a higher risk of adverse effects with use of narcotics post-op, including narcotic addiction/dependence, depressed breathing, death. °You are advised that taking > 50 morphine milligram equivalents (MME) of narcotic pain medication per day results in twice the risk of overdose or death. For your prescription provided: oxycodone 5 mg - taking more than 6 tablets per day would result in > 50 morphine milligram equivalents (MME) of narcotic pain medication. °Be advised that we will prescribe narcotics short-term, for acute post-operative pain only - 3 weeks for major operations such as shoulder repair/reconstruction surgeries.  ° ° °6. Post-Op Appointment: ° °Your first post-op appointment will be 10-14 days post-op. ° °7. Work or School: For most, but not all procedures, we advise staying out of work or school for at least 1 to 2 weeks in order to recover from the stress of surgery and to allow time for healing.  ° °If you need a work or school note this can be provided.  ° °8. Smoking: If you are a smoker, you need to refrain from smoking in the postoperative period. The nicotine in cigarettes will inhibit healing of your shoulder repair and decrease the chance of successful repair. Similarly, nicotine containing products (gum, patches) should be avoided.  ° °Post-operative Brace: °Apply and remove the brace you received as you were instructed   to at the time of fitting and as described in detail as the braces instructions for use indicate.  Wear the brace for the period of time prescribed by your physician.  The brace can be cleaned with soap and water and allowed to air dry only.  Should the brace result in increased pain, decreased feeling (numbness/tingling), increased swelling or an overall  worsening of your medical condition, please contact your doctor immediately.  If an emergency situation occurs as a result of wearing the brace after normal business hours, please dial 911 and seek immediate medical attention.  Let your doctor know if you have any further questions about the brace issued to you. Refer to the shoulder sling instructions for use if you have any questions regarding the correct fit of your shoulder sling.  [facility] Customer Care for Troubleshooting: [phone]  Video that illustrates how to properly use a shoulder sling: "Instructions for Proper Use of an Orthopaedic Sling" http://bass.com/https://www.youtube.com/watch?v=AHZpn_Xo45w

## 2018-05-06 NOTE — Op Note (Signed)
SURGERY DATE: 05/06/2018  PRE-OP DIAGNOSIS:  1. Right subacromial impingement 2. Right biceps tendinopathy 3. Right partial thickness rotator cuff tear 4. Right acromioclavicular joint osteoarthritis  POST-OP DIAGNOSIS: 1. Right subacromial impingement 2. Right biceps tendinopathy 3. Right partial thickness bursal sided rotator cuff tear 4. Right acromioclavicular joint osteoarthritis  PROCEDURES:  1. Right mini-open rotator cuff repair with Regeneten patch 2. Right open biceps tenodesis 3. Right distal clavicle excision 4. Right extensive debridement of shoulder (glenohumeral and subacromial spaces) 5. Right subacromial decompression  SURGEON: Rosealee Albee, MD  ANESTHESIA: Gen with interscalene block w/Exparil  ESTIMATED BLOOD LOSS: 250cc  DRAINS:  none  TOTAL IV FLUIDS: per anesthesia   SPECIMENS: none  IMPLANTS:  - [employer] Regeneten patch with associated tendon and bone staples - [employer] Q-fix all-suture Anchor - x1  OPERATIVE FINDINGS:  Examination under anesthesia: A careful examination under anesthesia was performed.  Passive range of motion was: FF: 160; ER at side: 50; ER in abduction: 90; IR in abduction: 50.  Anterior load shift: NT.  Posterior load shift: NT.  Sulcus in neutral: NT.  Sulcus in ER: NT.    Intra-operative findings: A thorough arthroscopic examination of the shoulder was performed.  The findings are: 1. Biceps tendon: tendinopathy with erythema 2. Superior labrum: Type 1 SLAP tear 3. Posterior labrum and capsule: normal 4. Inferior capsule and inferior recess: normal 5. Glenoid cartilage surface: Normal 6. Supraspinatus attachment: Small articular sided anterior supraspinatus tear ~20% of footprint. Significant thinning ~40-50% of supraspinatus on bursal side without full-thickness tear 7. Posterior rotator cuff attachment: normal 8. Humeral head articular cartilage: normal 9. Rotator interval: normal 10: Subscapularis tendon:  normal 11. Anterior labrum: normal 12. IGHL: normal  OPERATIVE REPORT:   Indications for procedure: Kristina Pena is a 47 y.o. year old female with ~3 years of shoulder pain. She has had multiple acromioclavicular joint injections and subacromial space injections with only temporary and partial relief of symptoms.  He has has failed nonoperative management including rest, activity modification, physical therapy, and/or corticosteroid injection. Clinical findings and imaging was concerning for a significant partial thickness bursal sided rotator cuff tear with AC joint osteoarthritis and biceps tendinopathy. After discussion of risks, benefits, and alternatives to surgery, the patient elected to proceed with above mentioned procedure. The patient understands that use of the Regeneten patch is relatively new and long-term data is unknown.  Procedure in detail:  I identified Kristina Pena in the pre-operative holding area.  I marked the operative shoulder with my initials. I reviewed the risks and benefits of the proposed surgical intervention, and the patient (and/or patient's guardian) wished to proceed.  Anesthesia was then performed with an interscalene block with Exparil.  The patient was transferred to the operative suite and placed in the beach chair position. Intubation was difficult and required multiple attempts.   SCDs were placed on the lower extremities. Appropriate IV antibiotics were administered. The operative upper extremity was then prepped and draped in standard fashion. A time out was performed confirming the correct extremity, correct patient and correct procedure.   I then created a standard posterior portal with an 11 blade. The glenohumeral joint was easily entered with a blunt trochar and the arthroscope introduced. The findings of diagnostic arthroscopy are described above. I debrided the anterior supraspinatus and also debrided and coagulated the inflamed synovium to  obtain hemostasis and reduce the risk of post-operative swelling using an Arthrocare radiofrequency device. I performed a  biceps tenotomy using an arthroscopic scissors and used a motorized shaver to debride the stump back to a stable base.   Next, the arthroscope was then introduced into the subacromial space. A direct lateral portal was created with an 11-blade after spinal needle localization. An extensive subacromial bursectomy was performed using a combination of the shaver and Arthrocare wand. The entire acromial undersurface was exposed and the CA ligament was subperiosteally elevated to expose the prominent anterior acromial hook. A burr was used to create a flat anterior and lateral aspect of the acromion, converting it from a Type 2 to a Type 1 acromion. Care was made to keep the deltoid fascia intact.  Next, I exposed the acromioclavicular joint using a combination of shaver and arthrocare wand. The distal 10mm of clavicle was removed using a 5.460mm burr (2 burr widths removed). Adequate resection was confirmed by placing the camera into the anterior portal and by using a probe to measure the distance between the acromion and distal clavicle. Care was taken to preserve the superior and posterior capsule. Hemostasis was achieved and fluid was evacuated from the shoulder.   A longitudinal incision from the anterolateral acromion ~6cm in length was made overlying the raphe between the anterior and middle heads of the deltoid. The raphe was identified and it was incised. The subacromial space was identified. Any remaining bursa was excised. We then turned our attention to the biceps tenodesis. The arm was externally rotated.  The bicipital groove was identified.  A 15 blade was used to make a cut overlying the biceps tendon, and the tendon was removed using a right angle clamp.  The base of the bicipital groove was identified and cleared of soft tissue.  A Q fix anchor was placed in the bicipital groove.   The biceps tendon was held at the appropriate amount of tension.  One set of sutures was passed through the biceps anchor with one limb passed in a simple fashion and the second limb passed in a simple plus locking stitch pattern. This was repeated for the other set of sutures.  This construct allowed for shuttling the biceps tendon down to the bone.  The sutures were tied and cut.  The diseased portion of the proximal biceps was then excised.  The arm was then internally rotated.  The bursal side of the rotator cuff was signfiicantly thinned over the supraspinatus. There was no full-thickness tear. We decided to proceed with Regeneten patch placement. The Regeneten patch delivery gun was placed appropriately and the patch was delivered over the supraspinatus tendon. It was positioned such that the bursal sided partial-thickness rotator cuff tear was covered. Tendon staples were placed medially, anteriorly, and posteriorly. Two bone staples was then placed laterally. The patch was then probed to confirm appropriate stability. Shoulder was taken through a range of motion and patch was noted to remain stable.  The wound was thoroughly irrigated.  The deltoid split was closed with 0 Vicryl.  The subdermal layer was closed with 2-0 Vicryl.  The skin was closed with 4-0 Monocryl and Dermabond. The portals were closed with 3-0 Nylon. Xeroform was applied to the portals. A sterile dressing was applied, followed by a Polar Care sleeve and a SlingShot shoulder immobilizer/sling. The patient awoke from anesthesia without difficulty and was transferred to the PACU in stable condition.    COMPLICATIONS: none  DISPOSITION: plan for discharge home after recovery in PACU  POSTOPERATIVE PLAN: Remain in sling (except hygiene and elbow/wrist/hand RoM exercises as  instructed by PT) x 4 weeks and NWB for this time. PT to begin 3-4 days after surgery. Use biceps tenodesis and distal clavicle excision protocol as Regeneten  patch protocol is too aggressive for biceps tenodesis healing. ENT evaluation of airway as an outpatient per Anesthesia recommendation

## 2018-05-06 NOTE — Anesthesia Procedure Notes (Signed)
Anesthesia Regional Block: Interscalene brachial plexus block   Pre-Anesthetic Checklist: ,, timeout performed, Correct Patient, Correct Site, Correct Laterality, Correct Procedure, Correct Position, site marked, Risks and benefits discussed,  Surgical consent,  Pre-op evaluation,  At surgeon's request and post-op pain management  Laterality: Upper and Right  Prep: chloraprep       Needles:  Injection technique: Single-shot  Needle Type: Stimiplex     Needle Length: 5cm  Needle Gauge: 22     Additional Needles:   Procedures:,,,, ultrasound used (permanent image in chart),,,,  Narrative:  Start time: 05/06/2018 7:07 AM End time: 05/06/2018 7:12 AM Injection made incrementally with aspirations every 5 mL.  Performed by: Personally  Anesthesiologist: Daphyne Miguez, Cleda MccreedyJoseph K, MD  Additional Notes: Functioning IV was confirmed and monitors were applied.  A 50mm 22ga Stimuplex needle was used. Sterile prep,hand hygiene and sterile gloves were used.  Minimal sedation used for procedure.  No paresthesia endorsed by patient during the procedure.  Negative aspiration and negative test dose prior to incremental administration of local anesthetic. The patient tolerated the procedure well with no immediate complications.

## 2018-05-06 NOTE — Transfer of Care (Signed)
Immediate Anesthesia Transfer of Care Note  Patient: Kristina Pena  Procedure(s) Performed: SHOULDER ARTHROSCOPY WITHDISTAL CLAVICLE EXCISION, pREGNERON PATCH, BICEPS TENODESIS (Right )  Patient Location: PACU  Anesthesia Type:General  Level of Consciousness: sedated  Airway & Oxygen Therapy: Patient Spontanous Breathing and Patient connected to face mask oxygen  Post-op Assessment: Report given to RN  Post vital signs: Reviewed and stable  Last Vitals:  Vitals Value Taken Time  BP 120/64 05/06/2018 11:16 AM  Temp    Pulse 80 05/06/2018 11:17 AM  Resp 17 05/06/2018 11:17 AM  SpO2 100 % 05/06/2018 11:17 AM  Vitals shown include unvalidated device data.  Last Pain:  Vitals:   05/06/18 0719  TempSrc:   PainSc: 0-No pain         Complications: No apparent anesthesia complications

## 2018-05-06 NOTE — Anesthesia Post-op Follow-up Note (Signed)
Anesthesia QCDR form completed.        

## 2018-05-06 NOTE — H&P (Signed)
Paper H&P to be scanned into permanent record. H&P reviewed. No significant changes noted.  

## 2018-05-06 NOTE — Anesthesia Preprocedure Evaluation (Addendum)
Anesthesia Evaluation  Patient identified by MRN, date of birth, ID band Patient awake    Reviewed: Allergy & Precautions, H&P , NPO status , Patient's Chart, lab work & pertinent test results  History of Anesthesia Complications (+) history of anesthetic complications (wheezing after hysterectomy, no hx asthma)  Airway Mallampati: III  TM Distance: >3 FB Neck ROM: full    Dental  (+) Chipped   Pulmonary asthma ,           Cardiovascular Exercise Tolerance: Good (-) angina(-) Past MI and (-) DOE negative cardio ROS       Neuro/Psych negative neurological ROS  negative psych ROS   GI/Hepatic Neg liver ROS, GERD  Medicated and Controlled,  Endo/Other  negative endocrine ROS  Renal/GU      Musculoskeletal   Abdominal   Peds  Hematology negative hematology ROS (+)   Anesthesia Other Findings Past Medical History: No date: Complication of anesthesia     Comment:  pt states she was wheezing after hysterectomy and md               asked if she had asthma and states she was never dx with               asthma before No date: GERD (gastroesophageal reflux disease)     Comment:  rare-tums prn  Past Surgical History: 2006: BREAST BIOPSY; Left     Comment:  benign No date: CERVICAL BIOPSY  W/ LOOP ELECTRODE EXCISION     Comment:  Dr. Willeen Cassosenau No date: DILATION AND CURETTAGE OF UTERUS No date: ENDOMETRIAL ABLATION No date: ENDOMETRIAL ABLATION     Comment:  Dr.Rosenau No date: LEEP july 2014: PARTIAL HYSTERECTOMY No date: VAGINAL DELIVERY     Comment:  x2  BMI    Body Mass Index:  29.63 kg/m      Reproductive/Obstetrics negative OB ROS                            Anesthesia Physical Anesthesia Plan  ASA: III  Anesthesia Plan: General ETT   Post-op Pain Management: GA combined w/ Regional for post-op pain   Induction: Intravenous  PONV Risk Score and Plan: Ondansetron,  Dexamethasone, Midazolam and Treatment may vary due to age or medical condition  Airway Management Planned: Oral ETT  Additional Equipment:   Intra-op Plan:   Post-operative Plan: Extubation in OR  Informed Consent: I have reviewed the patients History and Physical, chart, labs and discussed the procedure including the risks, benefits and alternatives for the proposed anesthesia with the patient or authorized representative who has indicated his/her understanding and acceptance.   Dental Advisory Given  Plan Discussed with: Anesthesiologist, CRNA and Surgeon  Anesthesia Plan Comments: (Patient consented for risks of anesthesia including but not limited to:  - adverse reactions to medications - damage to teeth, lips or other oral mucosa - sore throat or hoarseness - Damage to heart, brain, lungs or loss of life  Patient voiced understanding.)       Anesthesia Quick Evaluation

## 2018-05-07 ENCOUNTER — Encounter: Payer: Self-pay | Admitting: Orthopedic Surgery

## 2018-05-07 NOTE — Anesthesia Postprocedure Evaluation (Signed)
Anesthesia Post Note  Patient: Kristina Pena  Procedure(s) Performed: SHOULDER ARTHROSCOPY WITHDISTAL CLAVICLE EXCISION, pREGNERON PATCH, BICEPS TENODESIS (Right )  Patient location during evaluation: PACU Anesthesia Type: General Level of consciousness: awake and alert Pain management: pain level controlled Vital Signs Assessment: post-procedure vital signs reviewed and stable Respiratory status: spontaneous breathing, nonlabored ventilation, respiratory function stable and patient connected to nasal cannula oxygen Cardiovascular status: blood pressure returned to baseline and stable Postop Assessment: no apparent nausea or vomiting Anesthetic complications: yes Anesthetic complication details: PONV and anesthesia complicationsComments: Unanticipated difficult airway requiring multiple attempts at intubation - see documentation     Last Vitals:  Vitals:   05/06/18 1236 05/06/18 1313  BP: 126/70 114/65  Pulse: 78   Resp: 14   Temp: 36.7 C   SpO2:  100%    Last Pain:  Vitals:   05/06/18 1313  TempSrc:   PainSc: 0-No pain                 Jovita GammaKathryn L Fitzgerald

## 2018-05-08 ENCOUNTER — Encounter: Payer: Self-pay | Admitting: Orthopedic Surgery

## 2018-08-06 ENCOUNTER — Encounter: Payer: BLUE CROSS/BLUE SHIELD | Admitting: Internal Medicine

## 2018-08-06 ENCOUNTER — Encounter

## 2019-10-20 ENCOUNTER — Other Ambulatory Visit: Payer: Self-pay | Admitting: Obstetrics and Gynecology

## 2019-10-20 DIAGNOSIS — Z1231 Encounter for screening mammogram for malignant neoplasm of breast: Secondary | ICD-10-CM

## 2019-11-11 ENCOUNTER — Ambulatory Visit
Admission: RE | Admit: 2019-11-11 | Discharge: 2019-11-11 | Disposition: A | Discharge status: Home / Self Care | Payer: Managed Care, Other (non HMO) | Source: Ambulatory Visit | Attending: Obstetrics and Gynecology | Admitting: Obstetrics and Gynecology

## 2019-11-11 DIAGNOSIS — Z1231 Encounter for screening mammogram for malignant neoplasm of breast: Secondary | ICD-10-CM | POA: Diagnosis present

## 2021-04-01 ENCOUNTER — Other Ambulatory Visit: Payer: Self-pay | Admitting: Family Medicine

## 2021-04-01 DIAGNOSIS — M5441 Lumbago with sciatica, right side: Secondary | ICD-10-CM

## 2021-04-24 ENCOUNTER — Other Ambulatory Visit: Payer: Self-pay

## 2021-04-24 ENCOUNTER — Ambulatory Visit
Admission: RE | Admit: 2021-04-24 | Discharge: 2021-04-24 | Disposition: A | Discharge status: Home / Self Care | Payer: Managed Care, Other (non HMO) | Source: Ambulatory Visit | Attending: Family Medicine | Admitting: Family Medicine

## 2021-04-24 DIAGNOSIS — M5441 Lumbago with sciatica, right side: Secondary | ICD-10-CM

## 2022-02-23 ENCOUNTER — Other Ambulatory Visit: Payer: Self-pay | Admitting: Obstetrics and Gynecology

## 2022-02-23 DIAGNOSIS — Z1231 Encounter for screening mammogram for malignant neoplasm of breast: Secondary | ICD-10-CM

## 2022-03-08 ENCOUNTER — Ambulatory Visit
Admission: RE | Admit: 2022-03-08 | Discharge: 2022-03-08 | Disposition: A | Discharge status: Home / Self Care | Payer: Managed Care, Other (non HMO) | Source: Ambulatory Visit | Attending: Obstetrics and Gynecology | Admitting: Obstetrics and Gynecology

## 2022-03-08 DIAGNOSIS — Z1231 Encounter for screening mammogram for malignant neoplasm of breast: Secondary | ICD-10-CM | POA: Diagnosis present

## 2022-03-16 ENCOUNTER — Other Ambulatory Visit: Payer: Self-pay

## 2022-03-16 DIAGNOSIS — R928 Other abnormal and inconclusive findings on diagnostic imaging of breast: Secondary | ICD-10-CM

## 2022-03-16 DIAGNOSIS — N63 Unspecified lump in unspecified breast: Secondary | ICD-10-CM

## 2022-03-29 ENCOUNTER — Ambulatory Visit
Admission: RE | Admit: 2022-03-29 | Discharge: 2022-03-29 | Disposition: A | Discharge status: Home / Self Care | Payer: Managed Care, Other (non HMO) | Source: Ambulatory Visit

## 2022-03-29 DIAGNOSIS — R928 Other abnormal and inconclusive findings on diagnostic imaging of breast: Secondary | ICD-10-CM

## 2022-03-29 DIAGNOSIS — N63 Unspecified lump in unspecified breast: Secondary | ICD-10-CM | POA: Insufficient documentation

## 2023-07-11 ENCOUNTER — Emergency Department
Admission: EM | Admit: 2023-07-11 | Discharge: 2023-07-12 | Disposition: A | Discharge status: Home / Self Care | Payer: Managed Care, Other (non HMO) | Attending: Emergency Medicine | Admitting: Emergency Medicine

## 2023-07-11 ENCOUNTER — Other Ambulatory Visit: Payer: Self-pay

## 2023-07-11 ENCOUNTER — Emergency Department: Payer: Managed Care, Other (non HMO)

## 2023-07-11 DIAGNOSIS — Z23 Encounter for immunization: Secondary | ICD-10-CM | POA: Diagnosis not present

## 2023-07-11 DIAGNOSIS — S61212A Laceration without foreign body of right middle finger without damage to nail, initial encounter: Secondary | ICD-10-CM | POA: Diagnosis not present

## 2023-07-11 DIAGNOSIS — W268XXA Contact with other sharp object(s), not elsewhere classified, initial encounter: Secondary | ICD-10-CM | POA: Diagnosis not present

## 2023-07-11 DIAGNOSIS — S61214A Laceration without foreign body of right ring finger without damage to nail, initial encounter: Secondary | ICD-10-CM | POA: Diagnosis not present

## 2023-07-11 DIAGNOSIS — S6991XA Unspecified injury of right wrist, hand and finger(s), initial encounter: Secondary | ICD-10-CM | POA: Diagnosis present

## 2023-07-11 MED ORDER — LIDOCAINE HCL (PF) 1 % IJ SOLN
5.0000 mL | Freq: Once | INTRAMUSCULAR | Status: AC
  Administered 2023-07-12: 5 mL
  Filled 2023-07-11: qty 5

## 2023-07-11 MED ORDER — LIDOCAINE HCL (PF) 1 % IJ SOLN
10.0000 mL | Freq: Once | INTRAMUSCULAR | Status: AC
  Administered 2023-07-11: 10 mL
  Filled 2023-07-11: qty 10

## 2023-07-11 MED ORDER — TETANUS-DIPHTH-ACELL PERTUSSIS 5-2.5-18.5 LF-MCG/0.5 IM SUSY
0.5000 mL | PREFILLED_SYRINGE | Freq: Once | INTRAMUSCULAR | Status: AC
  Administered 2023-07-11: 0.5 mL via INTRAMUSCULAR
  Filled 2023-07-11: qty 0.5

## 2023-07-11 NOTE — ED Provider Notes (Signed)
[facility] Provider Note    Event Date/Time   First MD Initiated Contact with Patient 07/11/23 2316     (approximate)   History   Laceration   HPI  Kristina Pena is a 52 y.o. female who presents to the ED for evaluation of Laceration   Review annual PCP visit from June.  No anticoagulation.  Patient presents with accidental laceration to her right third and fourth fingers.  She was compressing a full trash can down when something sharp, a soup can or something, cut her fingers.   Physical Exam   Triage Vital Signs: ED Triage Vitals  Encounter Vitals Group     BP 07/11/23 1955 137/88     Systolic BP Percentile --      Diastolic BP Percentile --      Pulse Rate 07/11/23 1955 75     Resp 07/11/23 1955 18     Temp 07/11/23 1955 98.3 F (36.8 C)     Temp Source 07/11/23 1955 Oral     SpO2 07/11/23 1955 98 %     Weight 07/11/23 1956 178 lb (80.7 kg)     Height 07/11/23 1956 5\' 2"  (1.575 m)     Head Circumference --      Peak Flow --      Pain Score 07/11/23 2001 0     Pain Loc --      Pain Education --      Exclude from Growth Chart --     Most recent vital signs: Vitals:   07/11/23 1955  BP: 137/88  Pulse: 75  Resp: 18  Temp: 98.3 F (36.8 C)  SpO2: 98%    General: Awake, no distress.  CV:  Good peripheral perfusion.  Resp:  Normal effort.  Abd:  No distention.  MSK:  No deformity noted.  Neuro:  No focal deficits appreciated. Other:  Transverse lacerations to the volar aspect of third and fourth fingers.  No evidence of tendinous disruption.  Full active range of motion.   ED Results / Procedures / Treatments   Labs (all labs ordered are listed, but only abnormal results are displayed) Labs Reviewed - No data to display  EKG   RADIOLOGY Plain film of the right hand interpreted by me without evidence of fracture or dislocation.  Official radiology report(s): DG Hand Complete Right  Result Date:  07/11/2023 CLINICAL DATA:  Right finger lacerations. EXAM: RIGHT HAND - COMPLETE 3+ VIEW COMPARISON:  None Available. FINDINGS: Ill-defined mildly radiopaque gauze is seen overlying the fourth right finger. There is no evidence of fracture or dislocation. There is no evidence of arthropathy or other focal bone abnormality. A small superficial soft tissue laceration is suspected along the distal aspect of the fourth right finger. IMPRESSION: 1. No acute osseous abnormality. Electronically Signed   By: Aram Candela M.D.   On: 07/11/2023 21:16    PROCEDURES and INTERVENTIONS:  .Laceration Repair  Date/Time: 07/12/2023 4:26 AM  Performed by: Delton Prairie, MD Authorized by: Delton Prairie, MD   Consent:    Consent obtained:  Verbal   Consent given by:  Patient   Risks, benefits, and alternatives were discussed: yes   Laceration details:    Location:  Finger   Finger location:  R long finger   Length (cm):  3 Exploration:    Hemostasis achieved with:  Direct pressure   Imaging obtained: x-ray     Imaging outcome: foreign body not noted   Treatment:  Area cleansed with:  Povidone-iodine   Amount of cleaning:  Standard   Irrigation solution:  Sterile saline Skin repair:    Repair method:  Sutures   Suture size:  4-0   Wound skin closure material used: monocryl.   Suture technique:  Running locked   Number of sutures:  4 Approximation:    Approximation:  Close Repair type:    Repair type:  Simple Post-procedure details:    Procedure completion:  Tolerated well, no immediate complications .Laceration Repair  Date/Time: 07/12/2023 4:27 AM  Performed by: Delton Prairie, MD Authorized by: Delton Prairie, MD   Consent:    Consent obtained:  Verbal   Consent given by:  Patient   Risks, benefits, and alternatives were discussed: yes   Laceration details:    Location:  Finger   Finger location:  R ring finger   Length (cm):  2 Exploration:    Imaging obtained: x-ray     Imaging  outcome: foreign body not noted     Contaminated: no   Treatment:    Area cleansed with:  Povidone-iodine   Amount of cleaning:  Standard   Irrigation solution:  Sterile saline Skin repair:    Repair method:  Sutures   Suture size:  4-0   Wound skin closure material used: monocryl.   Suture technique:  Horizontal mattress   Number of sutures:  1 Approximation:    Approximation:  Close Repair type:    Repair type:  Simple Post-procedure details:    Procedure completion:  Tolerated well, no immediate complications   Medications  Tdap (BOOSTRIX) injection 0.5 mL (has no administration in time range)  lidocaine (PF) (XYLOCAINE) 1 % injection 5 mL (has no administration in time range)     IMPRESSION / MDM / ASSESSMENT AND PLAN / ED COURSE  I reviewed the triage vital signs and the nursing notes.  Differential diagnosis includes, but is not limited to, laceration, foreign body, tendinous disruption  {Patient presents with symptoms of an acute illness or injury that is potentially life-threatening.  Patient presents with accidental superficial lacerations to the volar aspects of third and fourth finger on the right, suitable for bedside repair and outpatient management.  Reassuring x-ray.  Digital block of this third and fourth fingers and repair with Monocryl as above.  Well-tolerated.  Clinical Course as of 07/12/23 0419  Thu Jul 12, 2023  0022 Right hand, 3rd digit x4 monocryl running locked 4-0 4th digit 1 horizontal mattress [DS]    Clinical Course User Index [DS] Delton Prairie, MD     FINAL CLINICAL IMPRESSION(S) / ED DIAGNOSES   Final diagnoses:  None     Rx / DC Orders   ED Discharge Orders     None        Note:  This document was prepared using Dragon voice recognition software and may include unintentional dictation errors.   Delton Prairie, MD 07/12/23 [phone]

## 2023-07-11 NOTE — ED Triage Notes (Signed)
Laceration to 2nd and 3rd digits on R hand. Reports obtained from something sharp in trash while attempting to compress garbage. Bleeding controlled and clean bandages placed in triage. Pt alert and oriented. Breathing unlabored. Ambulatory.

## 2023-07-12 NOTE — ED Notes (Signed)
Sutured wounds dressed

## 2023-07-12 NOTE — Discharge Instructions (Addendum)
4 stitches to the middle finger, 1 stitch to the ring finger.  They will all dissolve on their own after about 1 week  Gently wash the wound with soap and water.  It is okay to shower, but do not submerge in a bath or go swimming as it is healing.  Do not vigorously scrub.   Gently pat dry.   Once dry, then apply Neosporin or bacitracin or even Vaseline ointment to the area to act as a barrier to help prevent infection.
